# Patient Record
Sex: Female | Born: 1960 | Race: White | Hispanic: No | Smoking: Never smoker
Health system: Southern US, Community
[De-identification: ages and names within clinical notes are randomized; demographics above are authoritative.]

## PROBLEM LIST (undated history)

## (undated) DIAGNOSIS — B0089 Other herpesviral infection: Secondary | ICD-10-CM

## (undated) DIAGNOSIS — I8290 Acute embolism and thrombosis of unspecified vein: Secondary | ICD-10-CM

## (undated) DIAGNOSIS — F32A Depression, unspecified: Secondary | ICD-10-CM

## (undated) DIAGNOSIS — G43909 Migraine, unspecified, not intractable, without status migrainosus: Secondary | ICD-10-CM

## (undated) DIAGNOSIS — I10 Essential (primary) hypertension: Secondary | ICD-10-CM

## (undated) DIAGNOSIS — R7303 Prediabetes: Secondary | ICD-10-CM

## (undated) DIAGNOSIS — G4730 Sleep apnea, unspecified: Secondary | ICD-10-CM

## (undated) DIAGNOSIS — E669 Obesity, unspecified: Secondary | ICD-10-CM

## (undated) DIAGNOSIS — F329 Major depressive disorder, single episode, unspecified: Secondary | ICD-10-CM

## (undated) HISTORY — PX: CHOLECYSTECTOMY: SHX55

## (undated) HISTORY — PX: ABDOMINAL HYSTERECTOMY: SHX81

## (undated) HISTORY — PX: OOPHORECTOMY: SHX86

## (undated) HISTORY — PX: TUBAL LIGATION: SHX77

## (undated) HISTORY — PX: OTHER SURGICAL HISTORY: SHX169

---

## 2006-10-31 ENCOUNTER — Ambulatory Visit: Payer: Self-pay | Admitting: Unknown Physician Specialty

## 2007-11-03 ENCOUNTER — Ambulatory Visit: Payer: Self-pay | Admitting: Unknown Physician Specialty

## 2007-11-25 ENCOUNTER — Ambulatory Visit: Payer: Self-pay | Admitting: Unknown Physician Specialty

## 2008-05-10 ENCOUNTER — Ambulatory Visit: Payer: Self-pay | Admitting: Surgery

## 2008-11-25 ENCOUNTER — Ambulatory Visit: Payer: Self-pay | Admitting: Surgery

## 2009-10-11 ENCOUNTER — Emergency Department: Payer: Self-pay | Admitting: Emergency Medicine

## 2009-10-18 ENCOUNTER — Ambulatory Visit: Payer: Self-pay | Admitting: Surgery

## 2010-01-24 ENCOUNTER — Ambulatory Visit: Payer: Self-pay | Admitting: Surgery

## 2010-02-06 ENCOUNTER — Ambulatory Visit: Payer: Self-pay | Admitting: Surgery

## 2011-02-09 ENCOUNTER — Ambulatory Visit: Payer: Self-pay

## 2011-08-03 ENCOUNTER — Ambulatory Visit: Payer: Self-pay | Admitting: Physician Assistant

## 2012-03-06 ENCOUNTER — Ambulatory Visit: Payer: Self-pay

## 2013-03-10 ENCOUNTER — Ambulatory Visit: Payer: Self-pay

## 2013-04-01 ENCOUNTER — Ambulatory Visit: Payer: Self-pay

## 2014-08-04 ENCOUNTER — Ambulatory Visit: Payer: Self-pay

## 2014-08-13 ENCOUNTER — Ambulatory Visit: Payer: Self-pay

## 2015-01-19 ENCOUNTER — Other Ambulatory Visit: Payer: Self-pay | Admitting: Obstetrics and Gynecology

## 2015-01-19 DIAGNOSIS — N6320 Unspecified lump in the left breast, unspecified quadrant: Secondary | ICD-10-CM

## 2015-01-26 ENCOUNTER — Other Ambulatory Visit
Admission: RE | Admit: 2015-01-26 | Discharge: 2015-01-26 | Disposition: A | Discharge status: Home / Self Care | Payer: Managed Care, Other (non HMO) | Source: Other Acute Inpatient Hospital | Attending: Internal Medicine | Admitting: Internal Medicine

## 2015-01-26 DIAGNOSIS — R0602 Shortness of breath: Secondary | ICD-10-CM | POA: Insufficient documentation

## 2015-01-26 DIAGNOSIS — R079 Chest pain, unspecified: Secondary | ICD-10-CM | POA: Diagnosis present

## 2015-01-26 LAB — FIBRIN DERIVATIVES D-DIMER (ARMC ONLY): Fibrin derivatives D-dimer (ARMC): 235.6 (ref 0–499)

## 2015-01-26 LAB — TROPONIN I: Troponin I: <0.03 ng/mL (ref <0.031)

## 2015-02-15 ENCOUNTER — Ambulatory Visit
Admission: RE | Admit: 2015-02-15 | Discharge: 2015-02-15 | Disposition: A | Discharge status: Home / Self Care | Payer: Managed Care, Other (non HMO) | Source: Ambulatory Visit | Attending: Obstetrics and Gynecology | Admitting: Obstetrics and Gynecology

## 2015-02-15 ENCOUNTER — Other Ambulatory Visit: Payer: Self-pay | Admitting: Obstetrics and Gynecology

## 2015-02-15 DIAGNOSIS — N63 Unspecified lump in unspecified breast: Secondary | ICD-10-CM

## 2015-02-15 DIAGNOSIS — N6320 Unspecified lump in the left breast, unspecified quadrant: Secondary | ICD-10-CM

## 2015-02-16 ENCOUNTER — Other Ambulatory Visit: Payer: Self-pay | Admitting: Obstetrics and Gynecology

## 2015-02-16 DIAGNOSIS — N63 Unspecified lump in unspecified breast: Secondary | ICD-10-CM

## 2015-08-16 ENCOUNTER — Other Ambulatory Visit: Payer: Managed Care, Other (non HMO)

## 2015-08-16 ENCOUNTER — Ambulatory Visit: Payer: Managed Care, Other (non HMO) | Attending: Obstetrics and Gynecology

## 2015-10-13 ENCOUNTER — Encounter: Payer: Self-pay | Admitting: *Deleted

## 2015-10-14 ENCOUNTER — Encounter
Admission: RE | Disposition: A | Discharge status: Home / Self Care | Payer: Self-pay | Source: Ambulatory Visit | Attending: Unknown Physician Specialty

## 2015-10-14 ENCOUNTER — Ambulatory Visit
Admission: RE | Admit: 2015-10-14 | Discharge: 2015-10-14 | Disposition: A | Discharge status: Home / Self Care | Payer: Managed Care, Other (non HMO) | Source: Ambulatory Visit | Attending: Unknown Physician Specialty | Admitting: Unknown Physician Specialty

## 2015-10-14 ENCOUNTER — Ambulatory Visit: Payer: Managed Care, Other (non HMO) | Admitting: Anesthesiology

## 2015-10-14 ENCOUNTER — Encounter: Payer: Self-pay | Admitting: Anesthesiology

## 2015-10-14 DIAGNOSIS — Z79899 Other long term (current) drug therapy: Secondary | ICD-10-CM | POA: Insufficient documentation

## 2015-10-14 DIAGNOSIS — Z1211 Encounter for screening for malignant neoplasm of colon: Secondary | ICD-10-CM | POA: Diagnosis not present

## 2015-10-14 DIAGNOSIS — F329 Major depressive disorder, single episode, unspecified: Secondary | ICD-10-CM | POA: Diagnosis not present

## 2015-10-14 DIAGNOSIS — E119 Type 2 diabetes mellitus without complications: Secondary | ICD-10-CM | POA: Insufficient documentation

## 2015-10-14 DIAGNOSIS — Z7982 Long term (current) use of aspirin: Secondary | ICD-10-CM | POA: Diagnosis not present

## 2015-10-14 DIAGNOSIS — I10 Essential (primary) hypertension: Secondary | ICD-10-CM | POA: Diagnosis not present

## 2015-10-14 DIAGNOSIS — Z6837 Body mass index (BMI) 37.0-37.9, adult: Secondary | ICD-10-CM | POA: Insufficient documentation

## 2015-10-14 DIAGNOSIS — K635 Polyp of colon: Secondary | ICD-10-CM | POA: Insufficient documentation

## 2015-10-14 DIAGNOSIS — E669 Obesity, unspecified: Secondary | ICD-10-CM | POA: Diagnosis not present

## 2015-10-14 DIAGNOSIS — Z79890 Hormone replacement therapy (postmenopausal): Secondary | ICD-10-CM | POA: Diagnosis not present

## 2015-10-14 DIAGNOSIS — K640 First degree hemorrhoids: Secondary | ICD-10-CM | POA: Insufficient documentation

## 2015-10-14 DIAGNOSIS — D123 Benign neoplasm of transverse colon: Secondary | ICD-10-CM | POA: Diagnosis not present

## 2015-10-14 HISTORY — DX: Obesity, unspecified: E66.9

## 2015-10-14 HISTORY — DX: Major depressive disorder, single episode, unspecified: F32.9

## 2015-10-14 HISTORY — DX: Other herpesviral infection: B00.89

## 2015-10-14 HISTORY — DX: Migraine, unspecified, not intractable, without status migrainosus: G43.909

## 2015-10-14 HISTORY — DX: Depression, unspecified: F32.A

## 2015-10-14 HISTORY — PX: COLONOSCOPY WITH PROPOFOL: SHX5780

## 2015-10-14 HISTORY — DX: Essential (primary) hypertension: I10

## 2015-10-14 LAB — POCT PREGNANCY, URINE: PREG TEST UR: NEGATIVE

## 2015-10-14 LAB — GLUCOSE, CAPILLARY: Glucose-Capillary: 88 mg/dL (ref 65–99)

## 2015-10-14 SURGERY — COLONOSCOPY WITH PROPOFOL
Anesthesia: General

## 2015-10-14 MED ORDER — MIDAZOLAM HCL 2 MG/2ML IJ SOLN
INTRAMUSCULAR | Status: DC | PRN
Start: 2015-10-14 — End: 2015-10-14
  Administered 2015-10-14: 1 mg via INTRAVENOUS

## 2015-10-14 MED ORDER — FENTANYL CITRATE (PF) 100 MCG/2ML IJ SOLN
INTRAMUSCULAR | Status: DC | PRN
  Administered 2015-10-14: 50 ug via INTRAVENOUS

## 2015-10-14 MED ORDER — PHENYLEPHRINE HCL 10 MG/ML IJ SOLN
INTRAMUSCULAR | Status: DC | PRN
  Administered 2015-10-14 (×2): 25 ug via INTRAVENOUS

## 2015-10-14 MED ORDER — SODIUM CHLORIDE 0.9 % IV SOLN
INTRAVENOUS | Status: DC
  Administered 2015-10-14: 1000 mL via INTRAVENOUS

## 2015-10-14 MED ORDER — LIDOCAINE HCL (PF) 1 % IJ SOLN
INTRAMUSCULAR | Status: AC
  Filled 2015-10-14: qty 2

## 2015-10-14 MED ORDER — PROPOFOL 500 MG/50ML IV EMUL
INTRAVENOUS | Status: DC | PRN
  Administered 2015-10-14: 120 ug/kg/min via INTRAVENOUS

## 2015-10-14 NOTE — H&P (Signed)
Primary Care Physician:  Adin Hector, MD Primary Gastroenterologist:  Dr. Vira Agar  Pre-Procedure History & Physical: HPI:  Christine Carroll is a 55 y.o. female is here for an colonoscopy.   Past Medical History  Diagnosis Date  . Depression   . Diabetes mellitus without complication ([place])   . Herpetic whitlow   . Hypertension   . Migraines   . Obesity     Past Surgical History  Procedure Laterality Date  . Cesarean    . Tubal ligation    . Cholecystectomy      Prior to Admission medications   Medication Sig Start Date End Date Taking? Authorizing Provider  aspirin EC 81 MG tablet Take 81 mg by mouth daily.   Yes Historical Provider, MD  Black Cohosh 20 MG TABS Take 20 mg by mouth.   Yes Historical Provider, MD  citalopram (CELEXA) 40 MG tablet Take 40 mg by mouth daily.   Yes Historical Provider, MD  Cranberry 400 MG CAPS Take 400 capsules by mouth.   Yes Historical Provider, MD  ergocalciferol (VITAMIN D2) 50000 units capsule Take 50,000 Units by mouth once a week.   Yes Historical Provider, MD  estradiol (CLIMARA - DOSED IN MG/24 HR) 0.0375 mg/24hr patch Place 0.0375 mg onto the skin once a week.   Yes Historical Provider, MD  loratadine (CLARITIN) 10 MG tablet Take 10 mg by mouth daily.   Yes Historical Provider, MD  LORazepam (ATIVAN) 0.5 MG tablet Take 0.5 mg by mouth every 8 (eight) hours as needed for anxiety.   Yes Historical Provider, MD  medroxyPROGESTERone (PROVERA) 10 MG tablet Take 10 mg by mouth daily.   Yes Historical Provider, MD  nystatin [facility]) powder Apply topically 3 (three) times daily.   Yes Historical Provider, MD  vitamin B-12 (CYANOCOBALAMIN) 1000 MCG tablet Take 1,000 mcg by mouth daily.   Yes Historical Provider, MD  calcium carbonate (TUMS - DOSED IN MG ELEMENTAL CALCIUM) 500 MG chewable tablet Chew 1 tablet by mouth daily. Reported on 10/14/2015    Historical Provider, MD    Allergies as of 10/05/2015  . (Not on File)    Family History   Problem Relation Age of Onset  . Breast cancer Maternal Aunt     Social History   Social History  . Marital Status: Single    Spouse Name: N/A  . Number of Children: N/A  . Years of Education: N/A   Occupational History  . Not on file.   Social History Main Topics  . Smoking status: Never Smoker   . Smokeless tobacco: Never Used  . Alcohol Use: No  . Drug Use: No  . Sexual Activity: Not on file   Other Topics Concern  . Not on file   Social History Narrative    Review of Systems: See HPI, otherwise negative ROS  Physical Exam: BP 140/77 mmHg  Pulse 83  Temp(Src) 99.2 F (37.3 C) (Tympanic)  Resp 16  Ht 5\' 4"  (1.626 m)  Wt 97.977 kg (216 lb)  BMI 37.06 kg/m2  SpO2 96%  LMP  General:   Alert,  pleasant and cooperative in NAD Head:  Normocephalic and atraumatic. Neck:  Supple; no masses or thyromegaly. Lungs:  Clear throughout to auscultation.    Heart:  Regular rate and rhythm. Abdomen:  Soft, nontender and nondistended. Normal bowel sounds, without guarding, and without rebound.   Neurologic:  Alert and  oriented x4;  grossly normal neurologically.  Impression/Plan: Christine Rise  Carroll is here for an colonoscopy to be performed for screening  Risks, benefits, limitations, and alternatives regarding  colonoscopy have been reviewed with the patient.  Questions have been answered.  All parties agreeable.   Gaylyn Cheers, MD  10/14/2015, 11:05 AM

## 2015-10-14 NOTE — Anesthesia Procedure Notes (Signed)
Performed by: COOK-MARTIN, Jeris Roser Pre-anesthesia Checklist: Patient identified, Emergency Drugs available, Suction available, Patient being monitored and Timeout performed Patient Re-evaluated:Patient Re-evaluated prior to inductionOxygen Delivery Method: Nasal cannula Preoxygenation: Pre-oxygenation with 100% oxygen Intubation Type: IV induction Placement Confirmation: positive ETCO2 and CO2 detector       

## 2015-10-14 NOTE — Anesthesia Preprocedure Evaluation (Signed)
Anesthesia Evaluation  Patient identified by MRN, date of birth, ID band Patient awake    Reviewed: Allergy & Precautions, H&P , NPO status , Patient's Chart, lab work & pertinent test results, reviewed documented beta blocker date and time   History of Anesthesia Complications Negative for: history of anesthetic complications  Airway Mallampati: III  TM Distance: >3 FB Neck ROM: full    Dental no notable dental hx. (+) Caps, Missing   Pulmonary neg pulmonary ROS,    Pulmonary exam normal breath sounds clear to auscultation       Cardiovascular Exercise Tolerance: Good hypertension, On Medications (-) angina(-) CAD, (-) Past MI, (-) Cardiac Stents and (-) CABG Normal cardiovascular exam(-) dysrhythmias (-) Valvular Problems/Murmurs Rhythm:regular Rate:Normal     Neuro/Psych negative neurological ROS  negative psych ROS   GI/Hepatic negative GI ROS, Neg liver ROS,   Endo/Other  diabetes (borderline)  Renal/GU negative Renal ROS  negative genitourinary   Musculoskeletal   Abdominal   Peds  Hematology negative hematology ROS (+)   Anesthesia Other Findings Past Medical History:   Depression                                                   Diabetes mellitus without complication (HCC)                 Herpetic whitlow                                             Hypertension                                                 Migraines                                                    Obesity                                                      Reproductive/Obstetrics negative OB ROS                             Anesthesia Physical Anesthesia Plan  ASA: II  Anesthesia Plan: General   Post-op Pain Management:    Induction:   Airway Management Planned:   Additional Equipment:   Intra-op Plan:   Post-operative Plan:   Informed Consent: I have reviewed the patients History and  Physical, chart, labs and discussed the procedure including the risks, benefits and alternatives for the proposed anesthesia with the patient or authorized representative who has indicated his/her understanding and acceptance.   Dental Advisory Given  Plan Discussed with: Anesthesiologist, CRNA and Surgeon  Anesthesia Plan Comments:         Anesthesia Quick Evaluation

## 2015-10-14 NOTE — Anesthesia Postprocedure Evaluation (Signed)
Anesthesia Post Note  Patient: Christine Carroll  Procedure(s) Performed: Procedure(s) (LRB): COLONOSCOPY WITH PROPOFOL (N/A)  Patient location during evaluation: Endoscopy Anesthesia Type: General Level of consciousness: awake and alert Pain management: pain level controlled Vital Signs Assessment: post-procedure vital signs reviewed and stable Respiratory status: spontaneous breathing, nonlabored ventilation, respiratory function stable and patient connected to nasal cannula oxygen Cardiovascular status: blood pressure returned to baseline and stable Postop Assessment: no signs of nausea or vomiting Anesthetic complications: no    Last Vitals:  Filed Vitals:   10/14/15 1210 10/14/15 1220  BP: 121/65 119/76  Pulse: 67 69  Temp:    Resp: 15 16    Last Pain: There were no vitals filed for this visit.               Martha Clan

## 2015-10-14 NOTE — Transfer of Care (Signed)
Immediate Anesthesia Transfer of Care Note  Patient: Christine Carroll  Procedure(s) Performed: Procedure(s): COLONOSCOPY WITH PROPOFOL (N/A)  Patient Location: PACU  Anesthesia Type:General  Level of Consciousness: awake and sedated  Airway & Oxygen Therapy: Patient Spontanous Breathing and Patient connected to face mask oxygen  Post-op Assessment: Report given to RN and Post -op Vital signs reviewed and stable  Post vital signs: Reviewed and stable  Last Vitals:  Filed Vitals:   10/14/15 1007  BP: 140/77  Pulse: 83  Temp: 37.3 C  Resp: 16    Last Pain: There were no vitals filed for this visit.       Complications: No apparent anesthesia complications

## 2015-10-14 NOTE — Op Note (Signed)
[facility] Gastroenterology Patient Name: Christine Carroll Procedure Date: 10/14/2015 11:00 AM MRN: [id number] Account #: [id number] Date of Birth: 07/18/1960 Admit Type: Outpatient Age: 54 Room: [facility] ENDO ROOM 1 Gender: Female Note Status: Finalized Procedure:            Colonoscopy Indications:          Screening for colorectal malignant neoplasm Providers:            Manya Silvas, MD Referring MD:         Ramonita Lab, MD (Referring MD) Medicines:            Propofol per Anesthesia Complications:        No immediate complications. Procedure:            Pre-Anesthesia Assessment:                       - After reviewing the risks and benefits, the patient                        was deemed in satisfactory condition to undergo the                        procedure.                       After obtaining informed consent, the colonoscope was                        passed under direct vision. Throughout the procedure,                        the patient's blood pressure, pulse, and oxygen                        saturations were monitored continuously. The                        Colonoscope was introduced through the anus and                        advanced to the the cecum, identified by appendiceal                        orifice and ileocecal valve. The colonoscopy was                        performed without difficulty. The patient tolerated the                        procedure well. The quality of the bowel preparation                        was excellent. Findings:      A diminutive polyp was found in the transverse colon. The polyp was       sessile. The polyp was removed with a cold biopsy forceps. Resection and       retrieval were complete.      A diminutive polyp was found in the transverse colon. The polyp was       sessile. The polyp was removed with a cold biopsy forceps. Resection and  retrieval were complete.      A diminutive polyp was found in  the transverse colon. The polyp was       sessile. The polyp was removed with a hot snare. Resection and retrieval       were complete.      Internal hemorrhoids were found during endoscopy. The hemorrhoids were       small and Grade I (internal hemorrhoids that do not prolapse).      The exam was otherwise without abnormality. Impression:           - One diminutive polyp in the transverse colon, removed                        with a cold biopsy forceps. Resected and retrieved.                       - One diminutive polyp in the transverse colon, removed                        with a cold biopsy forceps. Resected and retrieved.                       - One diminutive polyp in the transverse colon, removed                        with a hot snare. Resected and retrieved.                       - Internal hemorrhoids.                       - The examination was otherwise normal. Recommendation:       - Await pathology results. Manya Silvas, MD 10/14/2015 11:45:10 AM This report has been signed electronically. Number of Addenda: 0 Note Initiated On: 10/14/2015 11:00 AM Scope Withdrawal Time: 0 hours 22 minutes 15 seconds  Total Procedure Duration: 0 hours 30 minutes 0 seconds       [facility]

## 2015-10-17 LAB — SURGICAL PATHOLOGY

## 2015-10-18 ENCOUNTER — Encounter: Payer: Self-pay | Admitting: Unknown Physician Specialty

## 2016-01-25 ENCOUNTER — Other Ambulatory Visit: Payer: Self-pay | Admitting: Obstetrics and Gynecology

## 2016-01-25 DIAGNOSIS — N6320 Unspecified lump in the left breast, unspecified quadrant: Secondary | ICD-10-CM

## 2016-01-25 DIAGNOSIS — N63 Unspecified lump in unspecified breast: Secondary | ICD-10-CM

## 2016-02-07 IMAGING — US US BREAST LTD UNI LEFT INC AXILLA
1 series · 9 of 9 positions shown · non-contrast
Comparison: 08/13/2014 and prior mammograms dating back to
07/14/2002

CLINICAL DATA: 54-year-old female -followup left breast masses.

EXAM:
DIGITAL DIAGNOSTIC LEFT MAMMOGRAM WITH 3D TOMOSYNTHESIS WITH CAD
ULTRASOUND LEFT BREAST

[Series 1: us breast ltd uni left inc axilla · 0.06mm/px · 9 of 9 slices shown]
[im 1/9]
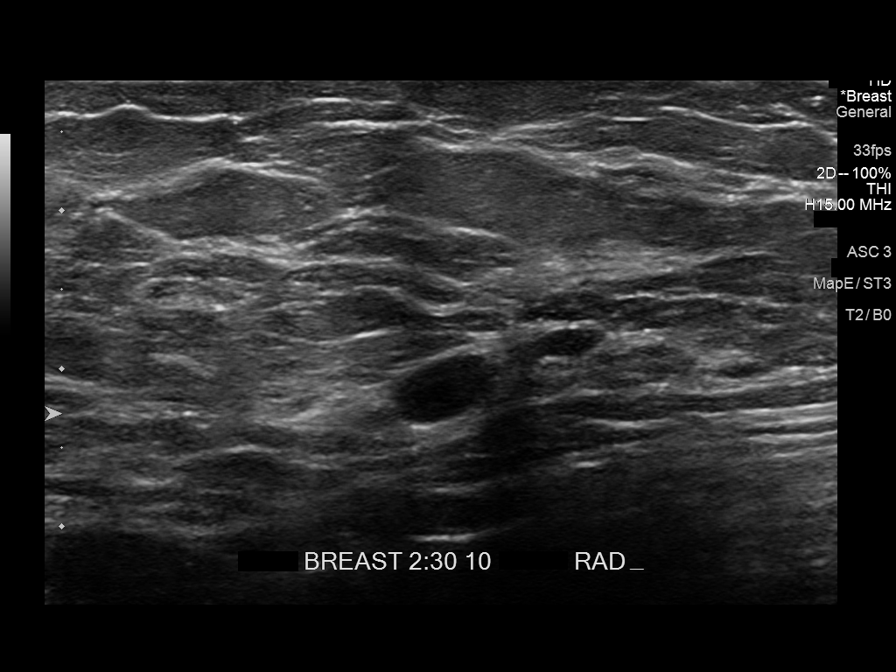
[im 2/9]
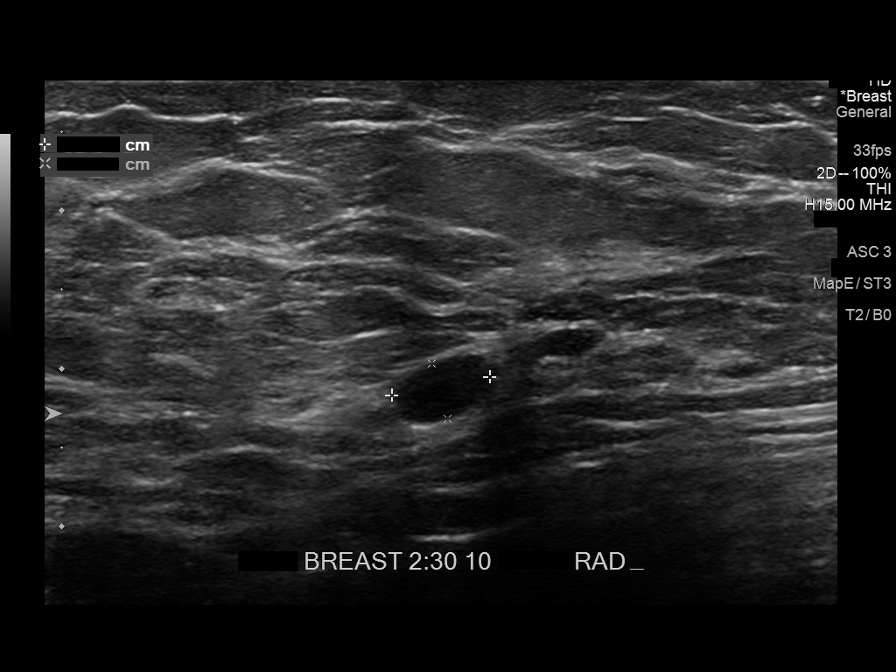
[im 3/9]
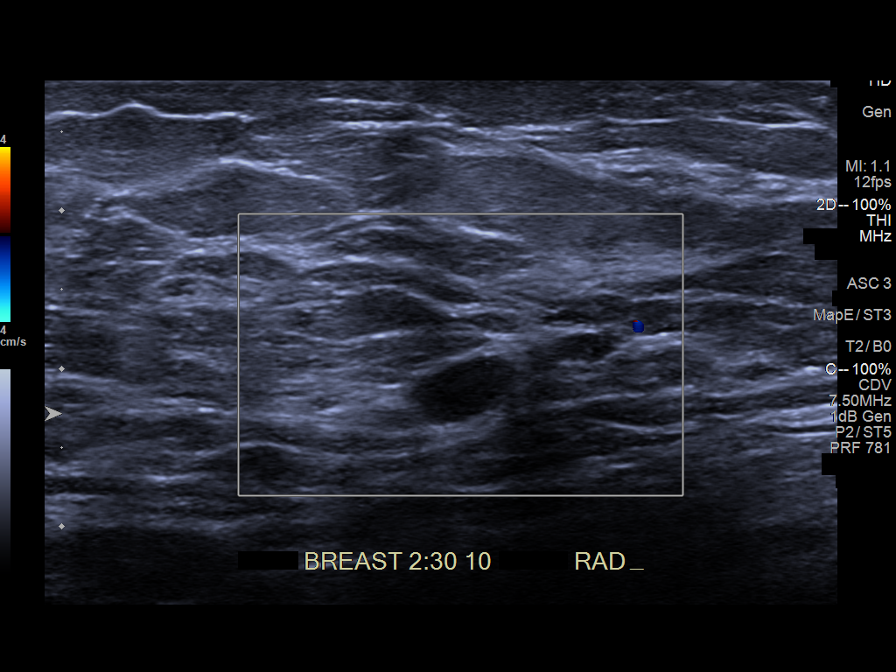
[im 4/9]
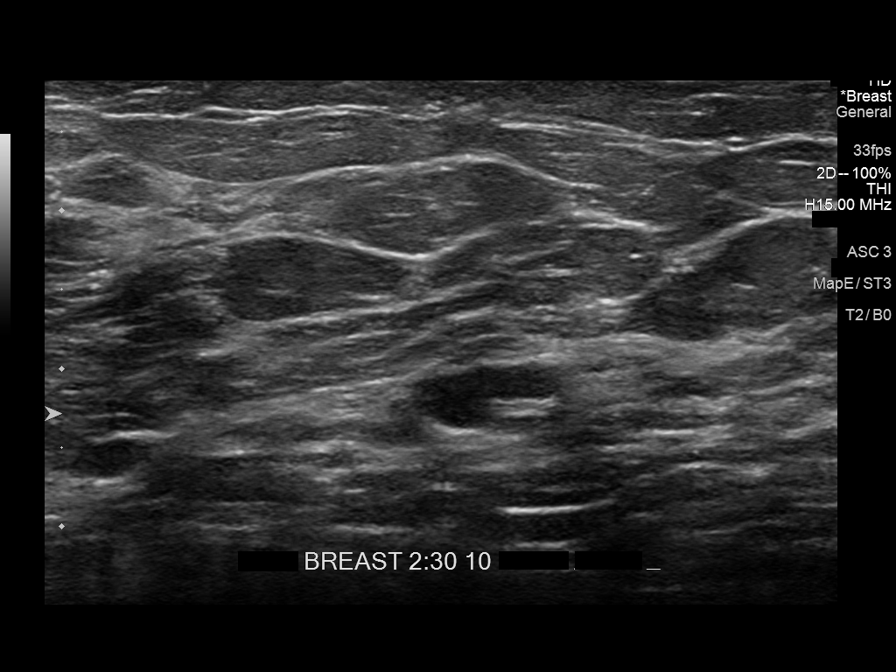
[im 5/9]
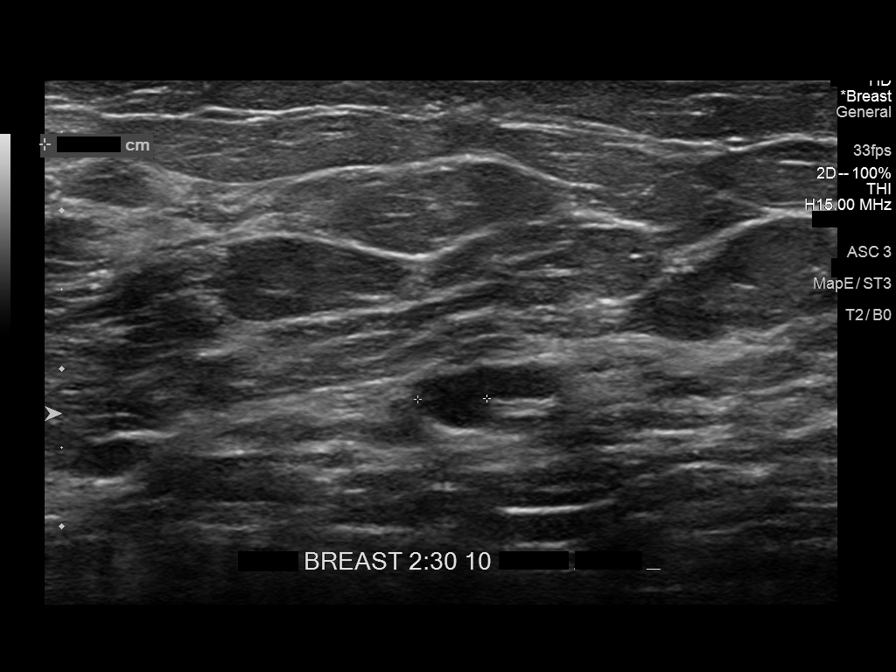
[im 6/9]
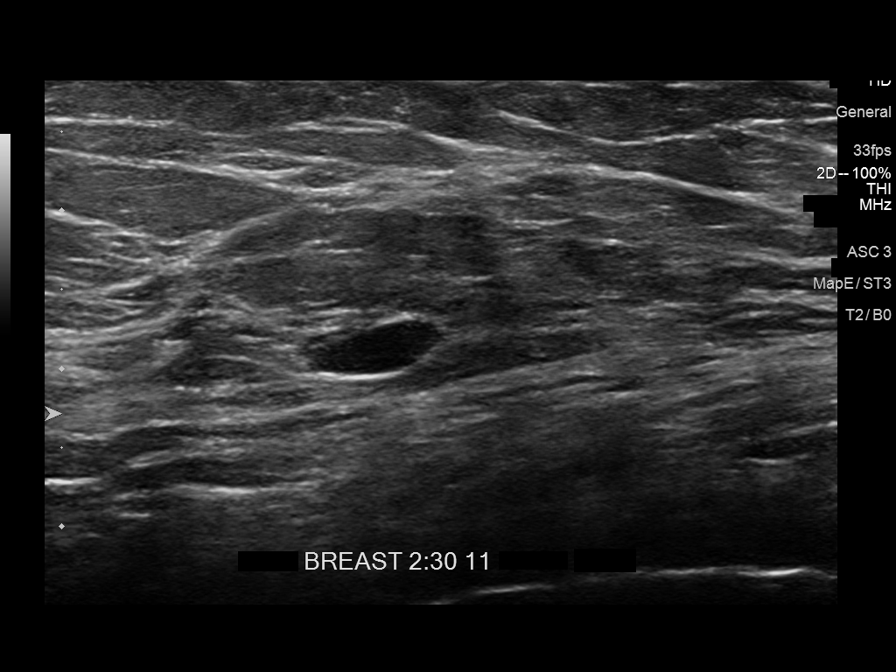
[im 7/9]
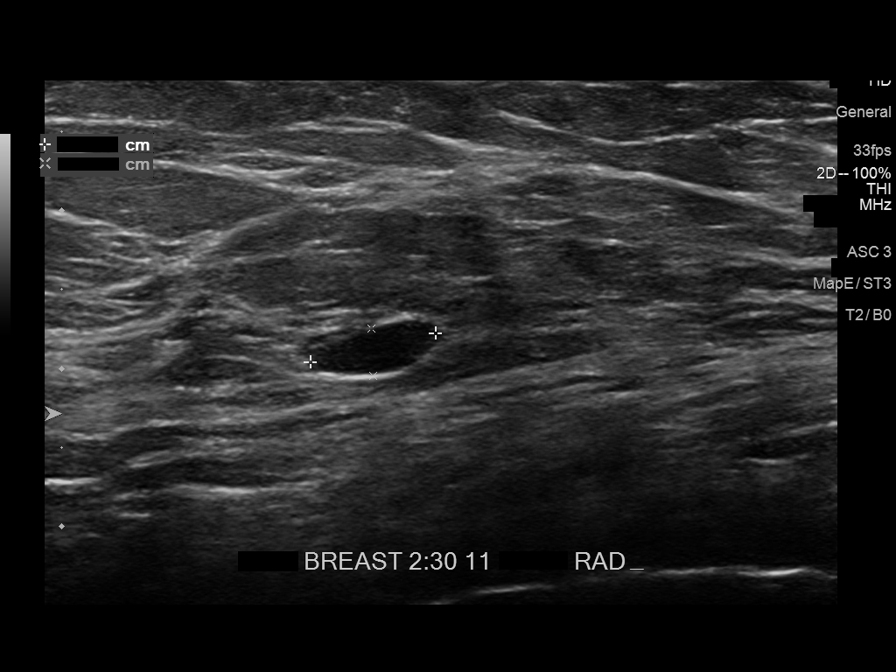
[im 8/9]
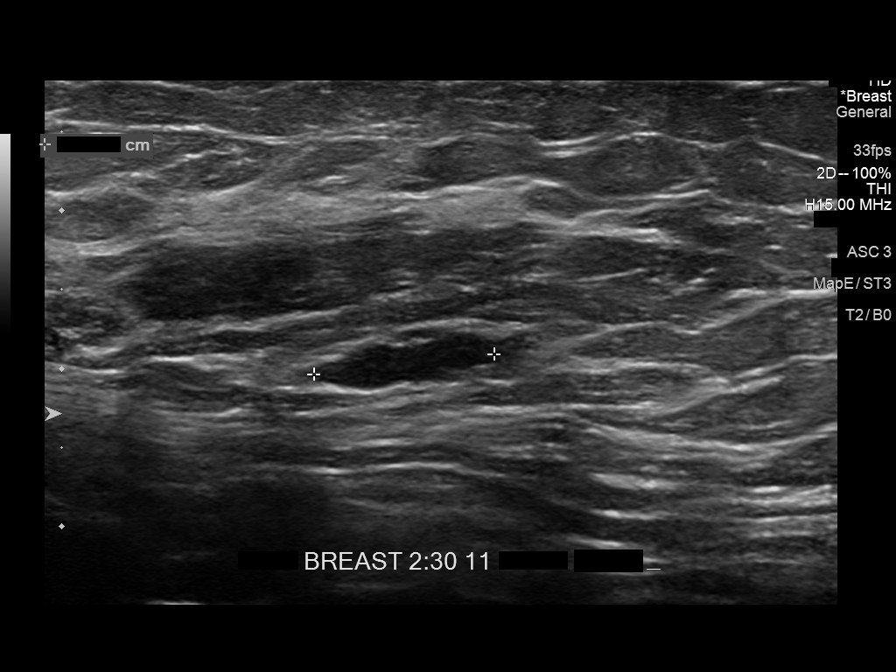
[im 9/9]
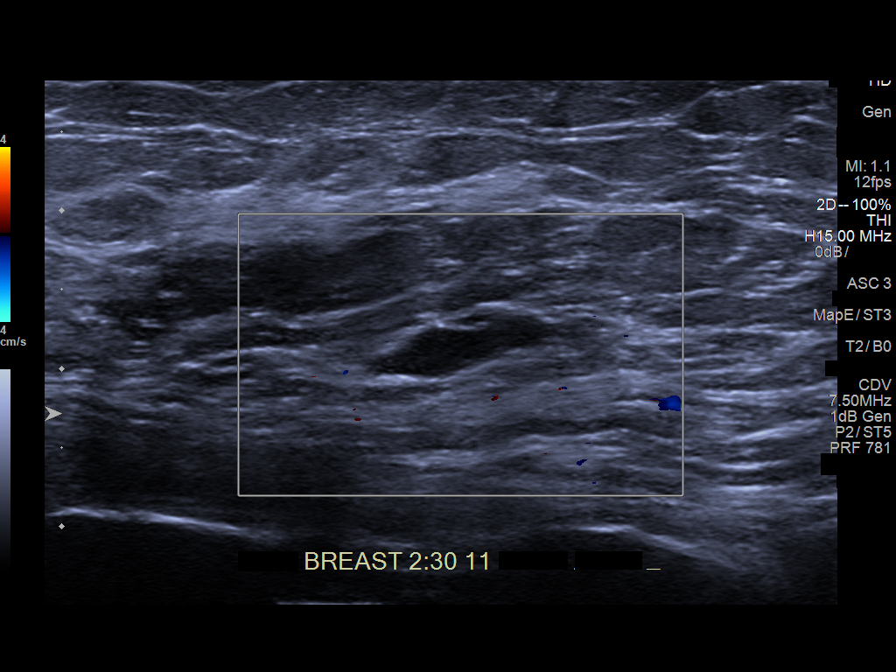

[9 of 9 positions shown; findings below may reference images not displayed]

ACR Breast Density Category b: There are scattered areas of
fibroglandular density.
FINDINGS: Routine 2D and 3D views the left breast demonstrate a stable
circumscribed oval mass within the posterior outer left breast.

No new mass, distortion or worrisome calcifications are identified
within the left breast.

Mammographic images were processed with CAD.

On physical exam, no palpable abnormalities identified within the
left breast.

Targeted ultrasound is performed, showing 2 very hypoechoic
circumscribed oval parallel masses within the left breast, the first
measuring 0.8 x 0.3 x 1.2 cm at the [DATE] position 11 cm from the
nipple and the second measuring 0.6 x 0.4 x 0.4 cm at the [DATE]
position 10 cm from the nipple. These likely represent benign cysts
but followup in 6 months is recommended to ensure 1 year stability.
IMPRESSION: Two very hypoechoic masses within the upper-outer left breast,
almost certainly representing benign cysts but followup in 6 months
is recommended to ensure 1 year stability.

RECOMMENDATION:
Bilateral diagnostic mammograms and left breast ultrasound in 6
months to resume annual mammogram schedule and to reassess likely
benign left breast finding.

I have discussed the findings and recommendations with the patient.
Results were also provided in writing at the conclusion of the
visit. If applicable, a reminder letter will be sent to the patient
regarding the next appointment.

BI-RADS CATEGORY  3: Probably benign.

## 2016-02-16 ENCOUNTER — Other Ambulatory Visit: Payer: Managed Care, Other (non HMO)

## 2016-02-16 ENCOUNTER — Ambulatory Visit: Payer: Managed Care, Other (non HMO) | Attending: Obstetrics and Gynecology

## 2016-03-20 ENCOUNTER — Ambulatory Visit
Admission: RE | Admit: 2016-03-20 | Discharge: 2016-03-20 | Disposition: A | Discharge status: Home / Self Care | Payer: Managed Care, Other (non HMO) | Source: Ambulatory Visit | Attending: Obstetrics and Gynecology | Admitting: Obstetrics and Gynecology

## 2016-03-20 DIAGNOSIS — N63 Unspecified lump in unspecified breast: Secondary | ICD-10-CM

## 2016-03-20 DIAGNOSIS — N6320 Unspecified lump in the left breast, unspecified quadrant: Secondary | ICD-10-CM

## 2016-04-03 ENCOUNTER — Emergency Department: Payer: Managed Care, Other (non HMO)

## 2016-04-03 ENCOUNTER — Observation Stay
Admission: EM | Admit: 2016-04-03 | Discharge: 2016-04-05 | Disposition: A | Discharge status: Home / Self Care | Payer: Managed Care, Other (non HMO) | Attending: Obstetrics and Gynecology | Admitting: Obstetrics and Gynecology

## 2016-04-03 DIAGNOSIS — D250 Submucous leiomyoma of uterus: Secondary | ICD-10-CM | POA: Diagnosis not present

## 2016-04-03 DIAGNOSIS — N950 Postmenopausal bleeding: Principal | ICD-10-CM | POA: Insufficient documentation

## 2016-04-03 DIAGNOSIS — E119 Type 2 diabetes mellitus without complications: Secondary | ICD-10-CM | POA: Insufficient documentation

## 2016-04-03 DIAGNOSIS — N939 Abnormal uterine and vaginal bleeding, unspecified: Secondary | ICD-10-CM | POA: Diagnosis present

## 2016-04-03 DIAGNOSIS — I10 Essential (primary) hypertension: Secondary | ICD-10-CM | POA: Insufficient documentation

## 2016-04-03 DIAGNOSIS — F329 Major depressive disorder, single episode, unspecified: Secondary | ICD-10-CM | POA: Diagnosis not present

## 2016-04-03 DIAGNOSIS — N9489 Other specified conditions associated with female genital organs and menstrual cycle: Secondary | ICD-10-CM | POA: Insufficient documentation

## 2016-04-03 DIAGNOSIS — N838 Other noninflammatory disorders of ovary, fallopian tube and broad ligament: Secondary | ICD-10-CM | POA: Insufficient documentation

## 2016-04-03 DIAGNOSIS — Z6838 Body mass index (BMI) 38.0-38.9, adult: Secondary | ICD-10-CM | POA: Insufficient documentation

## 2016-04-03 DIAGNOSIS — E669 Obesity, unspecified: Secondary | ICD-10-CM | POA: Diagnosis not present

## 2016-04-03 DIAGNOSIS — N840 Polyp of corpus uteri: Secondary | ICD-10-CM | POA: Insufficient documentation

## 2016-04-03 DIAGNOSIS — Z7982 Long term (current) use of aspirin: Secondary | ICD-10-CM | POA: Diagnosis not present

## 2016-04-03 DIAGNOSIS — Z885 Allergy status to narcotic agent status: Secondary | ICD-10-CM | POA: Insufficient documentation

## 2016-04-03 DIAGNOSIS — N800 Endometriosis of uterus: Secondary | ICD-10-CM | POA: Diagnosis not present

## 2016-04-03 DIAGNOSIS — N83202 Unspecified ovarian cyst, left side: Secondary | ICD-10-CM | POA: Diagnosis not present

## 2016-04-03 LAB — CBC WITH DIFFERENTIAL/PLATELET
BASOS ABS: 0.1 10*3/uL (ref 0–0.1)
BASOS PCT: 1 %
EOS PCT: 3 %
Eosinophils Absolute: 0.3 10*3/uL (ref 0–0.7)
HCT: 40 % (ref 35.0–47.0)
Hemoglobin: 13.8 g/dL (ref 12.0–16.0)
Lymphocytes Relative: 28 %
Lymphs Abs: 2.7 10*3/uL (ref 1.0–3.6)
MCH: 30.4 pg (ref 26.0–34.0)
MCHC: 34.6 g/dL (ref 32.0–36.0)
MCV: 88 fL (ref 80.0–100.0)
MONO ABS: 0.7 10*3/uL (ref 0.2–0.9)
Monocytes Relative: 7 %
Neutro Abs: 5.7 10*3/uL (ref 1.4–6.5)
Neutrophils Relative %: 61 %
PLATELETS: 352 10*3/uL (ref 150–440)
RBC: 4.55 MIL/uL (ref 3.80–5.20)
RDW: 13.2 % (ref 11.5–14.5)
WBC: 9.5 10*3/uL (ref 3.6–11.0)

## 2016-04-03 LAB — COMPREHENSIVE METABOLIC PANEL
ALT: 16 U/L (ref 14–54)
ANION GAP: 8 (ref 5–15)
AST: 20 U/L (ref 15–41)
Albumin: 4.1 g/dL (ref 3.5–5.0)
Alkaline Phosphatase: 69 U/L (ref 38–126)
BUN: 13 mg/dL (ref 6–20)
CHLORIDE: 107 mmol/L (ref 101–111)
CO2: 25 mmol/L (ref 22–32)
Calcium: 8.7 mg/dL — ABNORMAL LOW (ref 8.9–10.3)
Creatinine, Ser: 0.75 mg/dL (ref 0.44–1.00)
GFR calc Af Amer: >60 mL/min (ref >60)
Glucose, Bld: 100 mg/dL — ABNORMAL HIGH (ref 65–99)
POTASSIUM: 3.9 mmol/L (ref 3.5–5.1)
Sodium: 140 mmol/L (ref 135–145)
Total Bilirubin: 0.2 mg/dL — ABNORMAL LOW (ref 0.3–1.2)
Total Protein: 7 g/dL (ref 6.5–8.1)

## 2016-04-03 LAB — ABO/RH: ABO/RH(D): A POS

## 2016-04-03 LAB — TYPE AND SCREEN
ABO/RH(D): A POS
ANTIBODY SCREEN: NEGATIVE

## 2016-04-03 MED ORDER — IBUPROFEN 600 MG PO TABS
600.0000 mg | ORAL_TABLET | Freq: Four times a day (QID) | ORAL | Status: DC | PRN
  Administered 2016-04-03 (×2): 600 mg via ORAL
  Filled 2016-04-03 (×2): qty 1

## 2016-04-03 MED ORDER — LACTATED RINGERS IV SOLN
INTRAVENOUS | Status: DC
  Administered 2016-04-03: 15:00:00 via INTRAVENOUS

## 2016-04-03 MED ORDER — ONDANSETRON HCL 4 MG/2ML IJ SOLN
4.0000 mg | Freq: Four times a day (QID) | INTRAMUSCULAR | Status: DC | PRN
Start: 2016-04-03 — End: 2016-04-04

## 2016-04-03 MED ORDER — LACTATED RINGERS IV SOLN
INTRAVENOUS | Status: DC
  Administered 2016-04-03 – 2016-04-04 (×3): via INTRAVENOUS

## 2016-04-03 MED ORDER — MAGNESIUM HYDROXIDE 400 MG/5ML PO SUSP: 30.0000 mL | Freq: Every day | ORAL | Status: DC | PRN

## 2016-04-03 MED ORDER — HYDROCHLOROTHIAZIDE 12.5 MG PO CAPS
12.5000 mg | ORAL_CAPSULE | Freq: Every day | ORAL | Status: DC
  Administered 2016-04-03: 12.5 mg via ORAL
  Filled 2016-04-03: qty 1

## 2016-04-03 MED ORDER — DOCUSATE SODIUM 100 MG PO CAPS
100.0000 mg | ORAL_CAPSULE | Freq: Two times a day (BID) | ORAL | Status: DC
  Administered 2016-04-03 (×2): 100 mg via ORAL
  Filled 2016-04-03 (×2): qty 1

## 2016-04-03 MED ORDER — MAGNESIUM CITRATE PO SOLN
1.0000 | Freq: Once | ORAL | Status: DC | PRN
Start: 2016-04-03 — End: 2016-04-04
  Filled 2016-04-03: qty 296

## 2016-04-03 MED ORDER — HYDROMORPHONE HCL 1 MG/ML IJ SOLN
0.2000 mg | INTRAMUSCULAR | Status: DC | PRN
Start: 2016-04-03 — End: 2016-04-04
  Administered 2016-04-03: 0.6 mg via INTRAVENOUS
  Filled 2016-04-03: qty 1

## 2016-04-03 MED ORDER — ONDANSETRON HCL 4 MG/2ML IJ SOLN
INTRAMUSCULAR | Status: AC
  Administered 2016-04-03: 4 mg via INTRAVENOUS
  Filled 2016-04-03: qty 2

## 2016-04-03 MED ORDER — OXYCODONE-ACETAMINOPHEN 5-325 MG PO TABS: 1.0000 | ORAL_TABLET | ORAL | Status: DC | PRN

## 2016-04-03 MED ORDER — ZOLPIDEM TARTRATE 5 MG PO TABS
5.0000 mg | ORAL_TABLET | Freq: Every evening | ORAL | Status: DC | PRN
  Administered 2016-04-03: 5 mg via ORAL
  Filled 2016-04-03: qty 1

## 2016-04-03 MED ORDER — ALUM & MAG HYDROXIDE-SIMETH 200-200-20 MG/5ML PO SUSP: 30.0000 mL | ORAL | Status: DC | PRN

## 2016-04-03 MED ORDER — ONDANSETRON HCL 4 MG PO TABS: 4.0000 mg | ORAL_TABLET | Freq: Four times a day (QID) | ORAL | Status: DC | PRN

## 2016-04-03 MED ORDER — ONDANSETRON HCL 4 MG/2ML IJ SOLN
4.0000 mg | Freq: Once | INTRAMUSCULAR | Status: AC
  Administered 2016-04-03: 4 mg via INTRAVENOUS

## 2016-04-03 MED ORDER — BISACODYL 5 MG PO TBEC: 5.0000 mg | DELAYED_RELEASE_TABLET | Freq: Every day | ORAL | Status: DC | PRN

## 2016-04-03 MED ORDER — MORPHINE SULFATE (PF) 2 MG/ML IV SOLN
INTRAVENOUS | Status: AC
  Administered 2016-04-03: 4 mg via INTRAVENOUS
  Filled 2016-04-03: qty 2

## 2016-04-03 MED ORDER — CITALOPRAM HYDROBROMIDE 20 MG PO TABS
20.0000 mg | ORAL_TABLET | Freq: Every day | ORAL | Status: DC
  Administered 2016-04-03: 20 mg via ORAL
  Filled 2016-04-03: qty 1

## 2016-04-03 MED ORDER — CEFAZOLIN SODIUM-DEXTROSE 2-4 GM/100ML-% IV SOLN
2.0000 g | INTRAVENOUS | Status: AC
  Administered 2016-04-04: 2 g via INTRAVENOUS
  Filled 2016-04-03: qty 100

## 2016-04-03 MED ORDER — MORPHINE SULFATE (PF) 2 MG/ML IV SOLN
4.0000 mg | Freq: Once | INTRAVENOUS | Status: AC
  Administered 2016-04-03: 4 mg via INTRAVENOUS

## 2016-04-03 NOTE — ED Notes (Signed)
Report given to [place] in same day surgrey

## 2016-04-03 NOTE — ED Triage Notes (Signed)
Pt c/o lower abd cramping with vaginal bleeding since Saturday and had a biopsy yesterday and since the bleeding and pain is worse. Was instructed to come to the ED for further eval.

## 2016-04-03 NOTE — H&P (Signed)
Consult History and Physical   SERVICE: Gynecology   Patient Name: Christine Carroll Patient MRN:   [id number]  CC: Menorrhagia  HPI: KYMM SOCK is a 55 y.o. para 2 with hx of C/S x2 admitted for heavy  Menstrual bleeding. Filling overnight super pad q2 hrs now for several days, with significant pain and cramping. She was seen in the office yesterday and EMBx is pending. Pap smear up to date.  Ultrasound today in ER with small fibroids, thickened endometrial stripe and small hyperechoic area at the fundus. Normal ovaries.  Review of Systems: positives in bold GEN:   fevers, chills, weight changes, appetite changes, fatigue, night sweats HEENT:  HA, vision changes, hearing loss, congestion, rhinorrhea, sinus pressure, dysphagia CV:   CP, palpitations PULM:  SOB, cough GI:  abd pain, N/V/D/C GU:  dysuria, urgency, frequency MSK:  arthralgias, myalgias, back pain, swelling SKIN:  rashes, color changes, pallor NEURO:  numbness, weakness, tingling, seizures, dizziness, tremors PSYCH:  depression, anxiety, behavioral problems, confusion  HEME/LYMPH:  easy bruising or bleeding ENDO:  heat/cold intolerance  Past Obstetrical History: OB History    No data available      Past Gynecologic History: No LMP recorded. Patient is not currently having periods (Reason: Perimenopausal).  Past Medical History: Past Medical History:  Diagnosis Date  . Depression   . Diabetes mellitus without complication ([place])   . Herpetic whitlow   . Hypertension   . Migraines   . Obesity     Past Surgical History:   Past Surgical History:  Procedure Laterality Date  . cesarean    . CHOLECYSTECTOMY    . COLONOSCOPY WITH PROPOFOL N/A 10/14/2015   Procedure: COLONOSCOPY WITH PROPOFOL;  Surgeon: Manya Silvas, MD;  Location: [facility] ENDOSCOPY;  Service: Endoscopy;  Laterality: N/A;  . TUBAL LIGATION      Family History:  family history includes Breast cancer in her maternal aunt.  Social  History:  Social History   Social History  . Marital status: Divorced    Spouse name: N/A  . Number of children: N/A  . Years of education: N/A   Occupational History  . Not on file.   Social History Main Topics  . Smoking status: Never Smoker  . Smokeless tobacco: Never Used  . Alcohol use No  . Drug use: No  . Sexual activity: Not on file   Other Topics Concern  . Not on file   Social History Narrative  . No narrative on file    Home Medications:  Medications reconciled in EPIC  No current facility-administered medications on file prior to encounter.    Current Outpatient Prescriptions on File Prior to Encounter  Medication Sig Dispense Refill  . aspirin EC 81 MG tablet Take 81 mg by mouth daily.    . Black Cohosh 20 MG TABS Take 20 mg by mouth.    . calcium carbonate (TUMS - DOSED IN MG ELEMENTAL CALCIUM) 500 MG chewable tablet Chew 1 tablet by mouth daily. Reported on 10/14/2015    . citalopram (CELEXA) 40 MG tablet Take 40 mg by mouth daily.    . Cranberry 400 MG CAPS Take 400 capsules by mouth.    . ergocalciferol (VITAMIN D2) 50000 units capsule Take 50,000 Units by mouth once a week.    . estradiol (CLIMARA - DOSED IN MG/24 HR) 0.0375 mg/24hr patch Place 0.0375 mg onto the skin once a week.    . loratadine (CLARITIN) 10 MG tablet Take 10 mg by  mouth daily.    Marland Kitchen LORazepam (ATIVAN) 0.5 MG tablet Take 0.5 mg by mouth every 8 (eight) hours as needed for anxiety.    . medroxyPROGESTERone (PROVERA) 10 MG tablet Take 10 mg by mouth daily.    Marland Kitchen nystatin [facility]) powder Apply topically 3 (three) times daily.    . vitamin B-12 (CYANOCOBALAMIN) 1000 MCG tablet Take 1,000 mcg by mouth daily.      Allergies:  Allergies  Allergen Reactions  . Vicodin [Hydrocodone-Acetaminophen] Nausea And Vomiting    Physical Exam:  Temp:  [98 F (36.7 C)] 98 F (36.7 C) (11/07 0716) Pulse Rate:  [72-77] 72 (11/07 1000) Resp:  [17] 17 (11/07 0716) BP: (122-158)/(77-94) 158/94 (11/07  1000) SpO2:  [93 %-96 %] 93 % (11/07 1000) Weight:  [222 lb (100.7 kg)] 222 lb (100.7 kg) (11/07 0717)   General Appearance:  Well developed, well nourished, no acute distress, alert and oriented x3 HEENT:  Normocephalic atraumatic, extraocular movements intact, moist mucous membranes Cardiovascular:  Normal S1/S2, regular rate and rhythm, no murmurs Pulmonary:  clear to auscultation, no wheezes, rales or rhonchi, symmetric air entry, good air exchange Abdomen:  Bowel sounds present, soft, nontender, nondistended, no abnormal masses, no epigastric pain Extremities:  Full range of motion, no pedal edema, 2+ distal pulses, no tenderness Skin:  normal coloration and turgor, no rashes, no suspicious skin lesions noted  Neurologic:  Cranial nerves 2-12 grossly intact, normal muscle tone, strength 5/5 all four extremities Psychiatric:  Normal mood and affect, appropriate, no AH/VH Pelvic:  Deferred- from visit yesterday, good pelvic floor support, tender uterus, diffusely tender abdomen.   Labs/Studies:   CBC and Coags:  Lab Results  Component Value Date   WBC 9.5 04/03/2016   NEUTOPHILPCT 61 04/03/2016   EOSPCT 3 04/03/2016   BASOPCT 1 04/03/2016   LYMPHOPCT 28 04/03/2016   HGB 13.8 04/03/2016   HCT 40.0 04/03/2016   MCV 88.0 04/03/2016   PLT 352 04/03/2016   CMP:  Lab Results  Component Value Date   NA 140 04/03/2016   K 3.9 04/03/2016   CL 107 04/03/2016   CO2 25 04/03/2016   BUN 13 04/03/2016   CREATININE 0.75 04/03/2016   PROT 7.0 04/03/2016   BILITOT 0.2 (L) 04/03/2016   ALT 16 04/03/2016   AST 20 04/03/2016   ALKPHOS 69 04/03/2016    Other Imaging: US Pelvis Complete  Result Date: 04/03/2016 CLINICAL DATA:  Four days of vaginal bleeding. Uterine biopsy yesterday. The patient is perimenopausal and not on hormone replacement therapy. EXAM: TRANSABDOMINAL AND TRANSVAGINAL ULTRASOUND OF PELVIS TECHNIQUE: Both transabdominal and transvaginal ultrasound examinations of  the pelvis were performed. Transabdominal technique was performed for global imaging of the pelvis including uterus, ovaries, adnexal regions, and pelvic cul-de-sac. It was necessary to proceed with endovaginal exam following the transabdominal exam to visualize the uterus, endometrium, ovaries, and adnexal regions. COMPARISON:  None in PACs FINDINGS: Uterus Measurements: 10.2 x 5.4 x 7.0 cm. The uterine echotexture is heterogeneous. There is a focal hypoechoic sub endometrial region measuring 1.8 x 1.2 x 1.5 cm that may extend into the endometrium. There is a typical appearing fibroid in the anterior lateral aspect of the uterine fundus measuring 1.3 cm in diameter. Endometrium Thickness: 7 mm.  No definite focal abnormality observed. Right ovary Measurements: 2.2 x 1.4 x 1.6 cm. Normal appearance/no adnexal mass. Left ovary The left ovary was obscured by gas filled bowel. Other findings No abnormal free fluid. IMPRESSION: Heterogeneous echotexture of the  myometrium with an area of decreased echogenicity in the subendometrial region that may communicate with the endometrium. Correlation with findings from the patient's endometrial biopsy is needed. The ultrasonic findings today could be related to post biopsy change or could be unbiopsied and reflect malignancy. If yesterday's endometrial biopsy results are benign, sonohysterogram should be considered for focal lesion work-up prior to hysteroscopy. (Ref: Radiological Reasoning: Algorithmic Workup of Abnormal Vaginal Bleeding with Endovaginal Sonography and Sonohysterography. AJR 2008; [id number]) Nonvisualization of the left ovary. Normal appearance of the right ovary. Electronically Signed   By: David  Martinique M.D.   On: 04/03/2016 09:34   Mm Diag Breast Tomo Bilateral  Result Date: 03/20/2016 CLINICAL DATA:  Diagnostic mammogram/ultrasound report of 02/15/2015 described probably benign masses within the left breast at the 2:30 o'clock axis, 10 cm and 11 cm  from the nipple, at which time a follow-up diagnostic examination was recommended in 6 months.Patient returns today for that follow-up exam. EXAM: 2D DIGITAL DIAGNOSTIC BILATERAL MAMMOGRAM WITH CAD AND ADJUNCT TOMO COMPARISON:  Previous exam(s). ACR Breast Density Category c: The breast tissue is heterogeneously dense, which may obscure small masses. FINDINGS: There are no new dominant masses, suspicious calcifications or secondary signs of malignancy within either breast. The oval circumscribed and partially obscured masses within the upper-outer quadrant of the left breast, at posterior depth, appear stable mammographically. Mammographic images were processed with CAD. IMPRESSION: Stable probably benign masses within the upper-outer quadrant of the left breast, at posterior depth, most likely benign cysts based on previous ultrasound appearance. Findings are stable mammographically for over 18 months. Recommend additional follow-up left breast diagnostic mammogram in 6 months to complete a 2 year follow-up protocol and thereby prove benignity, with expectation to then return to a routine annual bilateral screening mammogram schedule. RECOMMENDATION: Follow-up left breast diagnostic mammogram, and possible ultrasound, in 6 months. I have discussed the findings and recommendations with the patient. Results were also provided in writing at the conclusion of the visit. If applicable, a reminder letter will be sent to the patient regarding the next appointment. BI-RADS CATEGORY  3: Probably benign. Electronically Signed   By: Franki Cabot M.D.   On: 03/20/2016 13:08     Assessment / Plan:   RYLEA RINNER is a 55 y.o.  Para 2 who presents with heavy and worsening uterine bleeding, with fibroids and a thickened endometrial stripe.  1.AUB: Ddx includes malignancy, ovarian, adenomyosis, fibroids. I have offered the patient high dose estrogen or lysteda to temporize her bleeding.  I have offered the patient a  D&C even without the results of the EMBx because of the severity of her sx. She is interested in definitve surgery and has requested a hysterectomy. Her labs and vitals are normal today. Because of the pain and volume of bleeding currently, definitive surgery is reasonable. I will plan for TLH/BS and because of prior C/S, risks of open procedure discussed with patient.  She is aware that if malignancy were found- and that this is a distinct possibility- she may need a second procedure.  She is aware of the risks/benefit and alternatives as above. Risks of surgery including bleeding which may require transfusion or reoperation, infection, injury to bowel or other surrounding organs, need for additional procedures including (laparoscopy or) laparotomy were explained to patient and written informed consent was obtained.  Patient has will be admitted for bleeding stabilization overnight and go as add on tomorrow.    Anesthesia and OR aware.  Preoperative prophylactic antibiotics and  SCDs ordered on call to the OR.

## 2016-04-03 NOTE — ED Provider Notes (Signed)
[facility] Emergency Department Provider Note        Time seen: ----------------------------------------- 7:25 AM on 04/03/2016 -----------------------------------------    I have reviewed the triage vital signs and the nursing notes.   HISTORY  Chief Complaint Vaginal Bleeding    HPI Christine Carroll is a 55 y.o. female who presents to ER for lower abdominal cramping with vaginal bleeding since Saturday. Patient states yesterday she had a biopsy of her uterus done by Dr. Leafy Ro. Since then the bleeding has been worse, she has had some cramping but is not as severe currently. She states currently the bleeding has lessened slightly. She denies feeling weak or dizzy, has not heard the reports of the biopsy. She called the OB/GYN office and was told to come here for evaluation.   Past Medical History:  Diagnosis Date  . Depression   . Diabetes mellitus without complication ([place])   . Herpetic whitlow   . Hypertension   . Migraines   . Obesity     There are no active problems to display for this patient.   Past Surgical History:  Procedure Laterality Date  . cesarean    . CHOLECYSTECTOMY    . COLONOSCOPY WITH PROPOFOL N/A 10/14/2015   Procedure: COLONOSCOPY WITH PROPOFOL;  Surgeon: Manya Silvas, MD;  Location: [facility] ENDOSCOPY;  Service: Endoscopy;  Laterality: N/A;  . TUBAL LIGATION      Allergies Vicodin [hydrocodone-acetaminophen]  Social History Social History  Substance Use Topics  . Smoking status: Never Smoker  . Smokeless tobacco: Never Used  . Alcohol use No    Review of Systems Constitutional: Negative for fever. Cardiovascular: Negative for chest pain. Respiratory: Negative for shortness of breath. Gastrointestinal: Positive for abdominal cramping Genitourinary: Negative for dysuria.Positive for vaginal bleeding Musculoskeletal: Negative for back pain. Skin: Negative for rash. Neurological: Negative for headaches, focal  weakness or numbness.  10-point ROS otherwise negative.  ____________________________________________   PHYSICAL EXAM:  VITAL SIGNS: ED Triage Vitals  Enc Vitals Group     BP 04/03/16 0716 122/77     Pulse Rate 04/03/16 0716 77     Resp 04/03/16 0716 17     Temp 04/03/16 0716 98 F (36.7 C)     Temp Source 04/03/16 0716 Oral     SpO2 04/03/16 0716 96 %     Weight 04/03/16 0717 222 lb (100.7 kg)     Height 04/03/16 0717 5\' 4"  (1.626 m)     Head Circumference --      Peak Flow --      Pain Score 04/03/16 0717 2     Pain Loc --      Pain Edu? --      Excl. in [place]? --     Constitutional: Alert and oriented. Well appearing and in no distress. Eyes: Conjunctivae are normal. PERRL. Normal extraocular movements. ENT   Head: Normocephalic and atraumatic.   Nose: No congestion/rhinnorhea.   Mouth/Throat: Mucous membranes are moist.   Neck: No stridor. Cardiovascular: Normal rate, regular rhythm. No murmurs, rubs, or gallops. Respiratory: Normal respiratory effort without tachypnea nor retractions. Breath sounds are clear and equal bilaterally. No wheezes/rales/rhonchi. Gastrointestinal: Soft and nontender. Normal bowel sounds Genitourinary: Musculoskeletal: Nontender with normal range of motion in all extremities. No lower extremity tenderness nor edema. Neurologic:  Normal speech and language. No gross focal neurologic deficits are appreciated.  Skin:  Skin is warm, dry and intact. No rash noted. Psychiatric: Mood and affect are normal. Speech and  behavior are normal.  ____________________________________________  ED COURSE:  Pertinent labs & imaging results that were available during my care of the patient were reviewed by me and considered in my medical decision making (see chart for details). Clinical Course   Patient is no distress, we will assess with labs and discuss with OB/GYN.  Procedures  IMPRESSION: Heterogeneous echotexture of the myometrium with an  area of decreased echogenicity in the subendometrial region that may communicate with the endometrium. Correlation with findings from the patient's endometrial biopsy is needed. The ultrasonic findings today could be related to post biopsy change or could be unbiopsied and reflect malignancy. If yesterday's endometrial biopsy results are benign, sonohysterogram should be considered for focal lesion work-up prior to hysteroscopy. (Ref: Radiological Reasoning: Algorithmic Workup of Abnormal Vaginal Bleeding with Endovaginal Sonography and Sonohysterography. AJR 2008; [id number])  Nonvisualization of the left ovary. Normal appearance of the right ovary.  ____________________________________________   LABS (pertinent positives/negatives)  Labs Reviewed  COMPREHENSIVE METABOLIC PANEL - Abnormal; Notable for the following:       Result Value   Glucose, Bld 100 (*)    Calcium 8.7 (*)    Total Bilirubin 0.2 (*)    All other components within normal limits  CBC WITH DIFFERENTIAL/PLATELET  TYPE AND SCREEN  TYPE AND SCREEN  ABO/RH   ____________________________________________  FINAL ASSESSMENT AND PLAN  Abnormal vaginal bleeding  Plan: Patient with labs and imaging as dictated above. Patient's case has been discussed with OB/GYN on call. Dr. Leafy Ro has, and evaluated the patient in the ER. She will be admitted overnight and will likely have a hysterectomy tomorrow. Patient is agreeable to plan. I did order IV morphine for pain.   Earleen Newport, MD   Note: This dictation was prepared with Dragon dictation. Any transcriptional errors that result from this process are unintentional    Earleen Newport, MD 04/03/16 1510

## 2016-04-03 NOTE — ED Notes (Signed)
Patient transported to Ultrasound 

## 2016-04-03 NOTE — ED Notes (Signed)
Pt refused IV start at this time, pt states she wants to wait till shes upstairs so it can be numbed, offered pt pain-ezze spray but pt refused

## 2016-04-04 ENCOUNTER — Observation Stay: Payer: Managed Care, Other (non HMO) | Admitting: Registered Nurse

## 2016-04-04 ENCOUNTER — Encounter: Payer: Self-pay | Admitting: Anesthesiology

## 2016-04-04 ENCOUNTER — Encounter
Admission: EM | Disposition: A | Discharge status: Home / Self Care | Payer: Self-pay | Source: Home / Self Care | Attending: Obstetrics and Gynecology

## 2016-04-04 HISTORY — PX: LAPAROSCOPIC HYSTERECTOMY: SHX1926

## 2016-04-04 LAB — GLUCOSE, CAPILLARY
GLUCOSE-CAPILLARY: 76 mg/dL (ref 65–99)
GLUCOSE-CAPILLARY: 126 mg/dL — AB (ref 65–99)

## 2016-04-04 SURGERY — HYSTERECTOMY, TOTAL, LAPAROSCOPIC
Anesthesia: General

## 2016-04-04 MED ORDER — SUCCINYLCHOLINE CHLORIDE 20 MG/ML IJ SOLN
INTRAMUSCULAR | Status: DC | PRN
  Administered 2016-04-04: 100 mg via INTRAVENOUS

## 2016-04-04 MED ORDER — DEXAMETHASONE SODIUM PHOSPHATE 10 MG/ML IJ SOLN
INTRAMUSCULAR | Status: DC | PRN
  Administered 2016-04-04: 4 mg via INTRAVENOUS

## 2016-04-04 MED ORDER — DOCUSATE SODIUM 100 MG PO CAPS
100.0000 mg | ORAL_CAPSULE | Freq: Two times a day (BID) | ORAL | Status: DC
  Administered 2016-04-04 – 2016-04-05 (×2): 100 mg via ORAL
  Filled 2016-04-04 (×2): qty 1

## 2016-04-04 MED ORDER — FENTANYL CITRATE (PF) 100 MCG/2ML IJ SOLN
INTRAMUSCULAR | Status: DC | PRN
  Administered 2016-04-04: 100 ug via INTRAVENOUS
  Administered 2016-04-04 (×2): 50 ug via INTRAVENOUS
  Administered 2016-04-04: 25 ug via INTRAVENOUS
  Administered 2016-04-04: 50 ug via INTRAVENOUS

## 2016-04-04 MED ORDER — ONDANSETRON HCL 4 MG/2ML IJ SOLN
INTRAMUSCULAR | Status: DC | PRN
Start: 2016-04-04 — End: 2016-04-04
  Administered 2016-04-04: 4 mg via INTRAVENOUS

## 2016-04-04 MED ORDER — CITALOPRAM HYDROBROMIDE 20 MG PO TABS
40.0000 mg | ORAL_TABLET | Freq: Every day | ORAL | Status: DC
  Administered 2016-04-04 – 2016-04-05 (×2): 40 mg via ORAL
  Filled 2016-04-04 (×2): qty 2

## 2016-04-04 MED ORDER — CELECOXIB 200 MG PO CAPS
200.0000 mg | ORAL_CAPSULE | Freq: Two times a day (BID) | ORAL | Status: DC
  Administered 2016-04-04 – 2016-04-05 (×2): 200 mg via ORAL
  Filled 2016-04-04 (×2): qty 1

## 2016-04-04 MED ORDER — NEOSTIGMINE METHYLSULFATE 10 MG/10ML IV SOLN
INTRAVENOUS | Status: DC | PRN
  Administered 2016-04-04: 5 mg via INTRAVENOUS

## 2016-04-04 MED ORDER — OXYCODONE HCL 5 MG PO CAPS: 5.0000 mg | ORAL_CAPSULE | Freq: Four times a day (QID) | ORAL | 0 refills | Status: AC | PRN

## 2016-04-04 MED ORDER — PROPOFOL 10 MG/ML IV BOLUS
INTRAVENOUS | Status: DC | PRN
  Administered 2016-04-04: 180 mg via INTRAVENOUS

## 2016-04-04 MED ORDER — MENTHOL 3 MG MT LOZG
1.0000 | LOZENGE | OROMUCOSAL | Status: DC | PRN
  Filled 2016-04-04: qty 9

## 2016-04-04 MED ORDER — GABAPENTIN 800 MG PO TABS: 800.0000 mg | ORAL_TABLET | Freq: Every day | ORAL | 0 refills | Status: DC

## 2016-04-04 MED ORDER — LACTATED RINGERS IV SOLN: INTRAVENOUS | Status: DC

## 2016-04-04 MED ORDER — ONDANSETRON HCL 4 MG/2ML IJ SOLN
4.0000 mg | Freq: Four times a day (QID) | INTRAMUSCULAR | Status: DC | PRN
Start: 2016-04-04 — End: 2016-04-05

## 2016-04-04 MED ORDER — ONDANSETRON HCL 4 MG/2ML IJ SOLN: 4.0000 mg | Freq: Once | INTRAMUSCULAR | Status: DC | PRN

## 2016-04-04 MED ORDER — ONDANSETRON HCL 4 MG PO TABS: 4.0000 mg | ORAL_TABLET | Freq: Four times a day (QID) | ORAL | Status: DC | PRN

## 2016-04-04 MED ORDER — MIDAZOLAM HCL 2 MG/2ML IJ SOLN
INTRAMUSCULAR | Status: DC | PRN
  Administered 2016-04-04: 2 mg via INTRAVENOUS

## 2016-04-04 MED ORDER — FENTANYL CITRATE (PF) 100 MCG/2ML IJ SOLN
INTRAMUSCULAR | Status: AC
  Administered 2016-04-04: 25 ug via INTRAVENOUS
  Filled 2016-04-04: qty 2

## 2016-04-04 MED ORDER — LIDOCAINE HCL (CARDIAC) 20 MG/ML IV SOLN
INTRAVENOUS | Status: DC | PRN
  Administered 2016-04-04: 100 mg via INTRAVENOUS

## 2016-04-04 MED ORDER — CELECOXIB 200 MG PO CAPS: 200.0000 mg | ORAL_CAPSULE | Freq: Two times a day (BID) | ORAL | 0 refills | Status: AC

## 2016-04-04 MED ORDER — ROCURONIUM BROMIDE 100 MG/10ML IV SOLN
INTRAVENOUS | Status: DC | PRN
  Administered 2016-04-04: 10 mg via INTRAVENOUS
  Administered 2016-04-04: 5 mg via INTRAVENOUS
  Administered 2016-04-04 (×2): 10 mg via INTRAVENOUS
  Administered 2016-04-04: 30 mg via INTRAVENOUS
  Administered 2016-04-04: 10 mg via INTRAVENOUS

## 2016-04-04 MED ORDER — HYDROCHLOROTHIAZIDE 12.5 MG PO CAPS
12.5000 mg | ORAL_CAPSULE | Freq: Every day | ORAL | Status: DC
Start: 2016-04-04 — End: 2016-04-05
  Administered 2016-04-04: 12.5 mg via ORAL
  Filled 2016-04-04: qty 1

## 2016-04-04 MED ORDER — ACETAMINOPHEN 500 MG PO TABS
1000.0000 mg | ORAL_TABLET | Freq: Four times a day (QID) | ORAL | Status: DC
  Administered 2016-04-04 – 2016-04-05 (×3): 1000 mg via ORAL
  Filled 2016-04-04 (×4): qty 2

## 2016-04-04 MED ORDER — ACETAMINOPHEN 500 MG PO TABS: 1000.0000 mg | ORAL_TABLET | Freq: Four times a day (QID) | ORAL | 0 refills | Status: AC

## 2016-04-04 MED ORDER — BUPIVACAINE HCL 0.5 % IJ SOLN
INTRAMUSCULAR | Status: DC | PRN
  Administered 2016-04-04: 15 mL

## 2016-04-04 MED ORDER — ONDANSETRON HCL 4 MG PO TABS: 4.0000 mg | ORAL_TABLET | Freq: Four times a day (QID) | ORAL | 0 refills | Status: DC | PRN

## 2016-04-04 MED ORDER — NYSTATIN 100000 UNIT/GM EX POWD
Freq: Three times a day (TID) | CUTANEOUS | Status: DC
  Filled 2016-04-04: qty 15

## 2016-04-04 MED ORDER — ACETAMINOPHEN 10 MG/ML IV SOLN
INTRAVENOUS | Status: DC | PRN
  Administered 2016-04-04: 1000 mg via INTRAVENOUS

## 2016-04-04 MED ORDER — FENTANYL CITRATE (PF) 100 MCG/2ML IJ SOLN
25.0000 ug | INTRAMUSCULAR | Status: DC | PRN
  Administered 2016-04-04 (×4): 25 ug via INTRAVENOUS

## 2016-04-04 MED ORDER — PHENYLEPHRINE HCL 10 MG/ML IJ SOLN
INTRAMUSCULAR | Status: DC | PRN
  Administered 2016-04-04: 100 ug via INTRAVENOUS

## 2016-04-04 MED ORDER — GABAPENTIN 300 MG PO CAPS
900.0000 mg | ORAL_CAPSULE | Freq: Every day | ORAL | Status: DC
  Administered 2016-04-04: 900 mg via ORAL
  Filled 2016-04-04: qty 3

## 2016-04-04 MED ORDER — GLYCOPYRROLATE 0.2 MG/ML IJ SOLN
INTRAMUSCULAR | Status: DC | PRN
  Administered 2016-04-04: 1 mg via INTRAVENOUS

## 2016-04-04 MED ORDER — HYDROMORPHONE HCL 1 MG/ML IJ SOLN: 0.2000 mg | INTRAMUSCULAR | Status: DC | PRN

## 2016-04-04 SURGICAL SUPPLY — 72 items
BAG URO DRAIN 2000ML W/SPOUT (MISCELLANEOUS) IMPLANT
BLADE SURG SZ11 CARB STEEL (BLADE) ×3 IMPLANT
CANISTER SUCT 1200ML W/VALVE (MISCELLANEOUS) ×3 IMPLANT
CATH FOLEY 2WAY  5CC 16FR (CATHETERS) ×2
CATH TRAY 16F METER LATEX (MISCELLANEOUS) ×3 IMPLANT
CATH URTH 16FR FL 2W BLN LF (CATHETERS) ×1 IMPLANT
CHLORAPREP W/TINT 26ML (MISCELLANEOUS) ×3 IMPLANT
CORD MONOPOLAR M/FML 12FT (MISCELLANEOUS) IMPLANT
DEFOGGER SCOPE WARMER CLEARIFY (MISCELLANEOUS) ×3 IMPLANT
DERMABOND ADVANCED (GAUZE/BANDAGES/DRESSINGS) ×2
DERMABOND ADVANCED .7 DNX12 (GAUZE/BANDAGES/DRESSINGS) ×1 IMPLANT
DEVICE SUTURE ENDOST 10MM (ENDOMECHANICALS) IMPLANT
DRAPE LAP W/FLUID (DRAPES) ×3 IMPLANT
DRAPE UNDER BUTTOCK W/FLU (DRAPES) ×3 IMPLANT
DRSG TEGADERM 2-3/8X2-3/4 SM (GAUZE/BANDAGES/DRESSINGS) ×9 IMPLANT
DRSG TELFA 3X8 NADH (GAUZE/BANDAGES/DRESSINGS) ×3 IMPLANT
ELECT CAUTERY BLADE 6.4 (BLADE) ×3 IMPLANT
ELECT REM PT RETURN 9FT ADLT (ELECTROSURGICAL) ×3
ELECTRODE REM PT RTRN 9FT ADLT (ELECTROSURGICAL) ×1 IMPLANT
ENDOSTITCH 0 SINGLE 48 (SUTURE) IMPLANT
GAUZE SPONGE 4X4 12PLY STRL (GAUZE/BANDAGES/DRESSINGS) ×3 IMPLANT
GAUZE SPONGE NON-WVN 2X2 STRL (MISCELLANEOUS) ×2 IMPLANT
GLOVE BIO SURGEON STRL SZ7 (GLOVE) ×12 IMPLANT
GLOVE INDICATOR 7.5 STRL GRN (GLOVE) ×9 IMPLANT
GOWN STRL REUS W/ TWL LRG LVL3 (GOWN DISPOSABLE) ×2 IMPLANT
GOWN STRL REUS W/ TWL XL LVL3 (GOWN DISPOSABLE) ×1 IMPLANT
GOWN STRL REUS W/TWL LRG LVL3 (GOWN DISPOSABLE) ×4
GOWN STRL REUS W/TWL XL LVL3 (GOWN DISPOSABLE) ×2
IRRIGATION STRYKERFLOW (MISCELLANEOUS) ×1 IMPLANT
IRRIGATOR STRYKERFLOW (MISCELLANEOUS) ×3
KIT RM TURNOVER CYSTO AR (KITS) ×3 IMPLANT
L-HOOK LAP DISP 36CM (ELECTROSURGICAL) ×3
LABEL OR SOLS (LABEL) ×3 IMPLANT
LHOOK LAP DISP 36CM (ELECTROSURGICAL) ×1 IMPLANT
LIGASURE BLUNT 5MM 37CM (INSTRUMENTS) ×3 IMPLANT
LIQUID BAND (GAUZE/BANDAGES/DRESSINGS) ×3 IMPLANT
MANIPULATOR VCARE LG CRV RETR (MISCELLANEOUS) IMPLANT
MANIPULATOR VCARE SML CRV RETR (MISCELLANEOUS) IMPLANT
MANIPULATOR VCARE STD CRV RETR (MISCELLANEOUS) ×3 IMPLANT
NEEDLE VERESS 14GA 120MM (NEEDLE) ×3 IMPLANT
NS IRRIG 500ML POUR BTL (IV SOLUTION) ×3 IMPLANT
OCCLUDER COLPOPNEUMO (BALLOONS) ×6 IMPLANT
PACK BASIN MAJOR ARMC (MISCELLANEOUS) ×3 IMPLANT
PACK GYN LAPAROSCOPIC (MISCELLANEOUS) ×3 IMPLANT
PAD OB MATERNITY 4.3X12.25 (PERSONAL CARE ITEMS) ×3 IMPLANT
PAD PREP 24X41 OB/GYN DISP (PERSONAL CARE ITEMS) ×3 IMPLANT
PENCIL ELECTRO HAND CTR (MISCELLANEOUS) ×3 IMPLANT
SCISSORS METZENBAUM CVD 33 (INSTRUMENTS) ×3 IMPLANT
SET CYSTO W/LG BORE CLAMP LF (SET/KITS/TRAYS/PACK) IMPLANT
SLEEVE ENDOPATH XCEL 5M (ENDOMECHANICALS) ×6 IMPLANT
SOL PREP PVP 2OZ (MISCELLANEOUS) ×3
SOLUTION PREP PVP 2OZ (MISCELLANEOUS) ×1 IMPLANT
SPONGE LAP 18X18 5 PK (GAUZE/BANDAGES/DRESSINGS) IMPLANT
SPONGE VERSALON 2X2 STRL (MISCELLANEOUS) ×4
SPONGE XRAY 4X4 16PLY STRL (MISCELLANEOUS) ×6 IMPLANT
STAPLER SKIN PROX 35W (STAPLE) IMPLANT
SURGILUBE 2OZ TUBE FLIPTOP (MISCELLANEOUS) ×3 IMPLANT
SUT VIC AB 0 CT1 27 (SUTURE)
SUT VIC AB 0 CT1 27XCR 8 STRN (SUTURE) IMPLANT
SUT VIC AB 0 CT1 36 (SUTURE) ×6 IMPLANT
SUT VIC AB 2-0 SH 27 (SUTURE)
SUT VIC AB 2-0 SH 27XBRD (SUTURE) IMPLANT
SUT VIC AB 2-0 UR6 27 (SUTURE) IMPLANT
SUT VICRYL PLUS ABS 0 54 (SUTURE) IMPLANT
SYR 50ML LL SCALE MARK (SYRINGE) ×3 IMPLANT
SYR BULB IRRIG 60ML STRL (SYRINGE) IMPLANT
SYRINGE 10CC LL (SYRINGE) ×3 IMPLANT
TRAY PREP VAG/GEN (MISCELLANEOUS) ×3 IMPLANT
TROCAR ENDO BLADELESS 11MM (ENDOMECHANICALS) ×3 IMPLANT
TROCAR XCEL NON-BLD 5MMX100MML (ENDOMECHANICALS) ×3 IMPLANT
TUBING INSUFFLATOR HEATED (MISCELLANEOUS) ×3 IMPLANT
WATER STERILE IRR 1000ML POUR (IV SOLUTION) ×3 IMPLANT

## 2016-04-04 NOTE — Anesthesia Procedure Notes (Signed)
Procedure Name: Intubation Performed by: Roslin Norwood Pre-anesthesia Checklist: Patient identified, Patient being monitored, Timeout performed, Emergency Drugs available and Suction available Patient Re-evaluated:Patient Re-evaluated prior to inductionOxygen Delivery Method: Circle system utilized Preoxygenation: Pre-oxygenation with 100% oxygen Intubation Type: IV induction Ventilation: Mask ventilation without difficulty Laryngoscope Size: Mac and 3 Grade View: Grade I Tube type: Oral Tube size: 7.0 mm Number of attempts: 1 Airway Equipment and Method: Stylet Placement Confirmation: ETT inserted through vocal cords under direct vision,  positive ETCO2 and breath sounds checked- equal and bilateral Secured at: 19 cm Tube secured with: Tape Dental Injury: Teeth and Oropharynx as per pre-operative assessment        

## 2016-04-04 NOTE — Interval H&P Note (Signed)
History and Physical Interval Note:  04/04/2016 12:03 PM  Christine Carroll  has presented today for surgery, with the diagnosis of POST MENOPAUSAL BLEEDING.  She was admitted into the hospital overnight for control of her bleeding and pain after arriving for evaluation in the ER last night. She was added onto the OR schedule. The reading pathologist of the office [facility] from 2 days ago was contacted and confirmed that our sample did not contain malignancy. A D&C would be a more thorough sample, and this was offered to her again. However, she confirms her desire for definitive surgery, and we will move forward with a TLH, BS.  The various methods of treatment have been discussed with the patient and family. After consideration of risks, benefits and other options for treatment, the patient has consented to  Procedure(s): HYSTERECTOMY TOTAL LAPAROSCOPIC/ BILATERAL SALPINGECTOMY (N/A) HYSTERECTOMY ABDOMINAL WITH SALPINGECTOMY (N/A) as a surgical intervention .  The patient's history has been reviewed, patient examined, no change in status, stable for surgery.  I have reviewed the patient's chart and labs.  Questions were answered to the patient's satisfaction.     [name]

## 2016-04-04 NOTE — Transfer of Care (Signed)
Immediate Anesthesia Transfer of Care Note  Patient: Christine Carroll  Procedure(s) Performed: Procedure(s): HYSTERECTOMY TOTAL LAPAROSCOPIC/ BILATERAL SALPINGECTOMY (N/A)  Patient Location: PACU  Anesthesia Type:General  Level of Consciousness: sedated and responds to stimulation  Airway & Oxygen Therapy: Patient Spontanous Breathing and Patient connected to face mask oxygen  Post-op Assessment: Report given to RN and Post -op Vital signs reviewed and stable  Post vital signs: Reviewed and stable  Last Vitals:  Vitals:   04/04/16 1142 04/04/16 1511  BP: (!) 142/76 (!) 126/59  Pulse: 81 99  Resp: 16   Temp: 36.7 C     Last Pain:  Vitals:   04/04/16 1142  TempSrc: Tympanic  PainSc:          Complications: No apparent anesthesia complications

## 2016-04-04 NOTE — Progress Notes (Signed)
Day of Surgery Procedure(s) (LRB): HYSTERECTOMY TOTAL LAPAROSCOPIC/ BILATERAL SALPINGECTOMY (N/A)  Subjective: Patient reports incisional pain.    Objective: I have reviewed patient's vital signs, intake and output and medications.  General: alert, cooperative, fatigued and no distress GI: soft, non-tender; bowel sounds normal; no masses,  no organomegaly and incision: clean and dry Extremities: extremities normal, atraumatic, no cyanosis or edema  Assessment: s/p Procedure(s): HYSTERECTOMY TOTAL LAPAROSCOPIC/ BILATERAL SALPINGECTOMY (N/A): stable  Plan: Advance diet Encourage ambulation Advance to PO medication  LOS: 1 day    Christine Carroll 04/04/2016, 5:12 PM

## 2016-04-04 NOTE — Op Note (Signed)
Christine Carroll PROCEDURE DATE: 04/03/2016 - 04/04/2016  PREOPERATIVE DIAGNOSIS:  POSTOPERATIVE DIAGNOSIS: The same PROCEDURE: Lysis of adhesions, total laparoscopic hysterectomy with vaginal closure, bilateral salpingectomy, left oopherectomy SURGEON:  Dr. [name] ASSISTANT: Dr. Vikki Ports Ward Anesthesiologist: Gunnar Bulla, MD Anesthesiologist: Gunnar Bulla, MD CRNA: Doreen Salvage, CRNA; Lance Muss, CRNA  INDICATIONS: 55 y.o. para 2 with hx of C/S x2 and postmenopausal bleeding and pelvic pain  here for definitive surgical management secondary to the indications listed under preoperative diagnoses; please see preoperative note for further details.  Risks of surgery were discussed with the patient including but not limited to: bleeding which may require transfusion or reoperation; infection which may require antibiotics; injury to bowel, bladder, ureters or other surrounding organs; need for additional procedures; thromboembolic phenomenon, incisional problems and other postoperative/anesthesia complications. Written informed consent was obtained.    FINDINGS:  Anterior midline omental adhesions that were taking down prior to the start of pelvic portion of the case. Anterior adhesions between the uterus and bladder. Normal uterus, bilateral tubes and right ovary. Left ovary with complex-appearing cyst. Normal appendix. Normal upper abdomen.   ANESTHESIA:    General INTRAVENOUS FLUIDS:1200  ml ESTIMATED BLOOD LOSS:20 ml URINE OUTPUT: 235 ml   SPECIMENS: Uterus, cervix, bilateral fallopian tubes, left ovary. COMPLICATIONS: None immediate  PROCEDURE IN DETAIL:  The patient received prophalactic intravenous antibiotics and had sequential compression devices applied to her lower extremities while in the preoperative area.  She was then taken to the operating room where general anesthesia was administered and was found to be adequate.  She was placed in the dorsal lithotomy position, and was  prepped and draped in a sterile manner.  A formal time out was performed with all team members present and in agreement.  A V-care uterine manipulator was placed at this time.  A Foley catheter was inserted into her bladder and attached to constant drainage. Attention was turned to the abdomen and 0.5% Marcaine infused subq. A 80mm umbilical incision was made with the scalpel.  The Optiview 5-mm trocar and sleeve were then advanced without difficulty with the laparoscope under direct visualization into the abdomen.  The abdomen was then insufflated with carbon dioxide gas and adequate pneumoperitoneum was obtained.  A survey of the patient's pelvis and abdomen revealed the findings above.  Bilateral lower quadrant ports (5 mm on the right and 5 mm on the left) were then placed under direct visualization.   Omental adhesions were taken down using the bipolar Ligasure device, after careful examination to be certain the bowel was not adherent.    The pelvis was then carefully examined.  Attention was turned to the fallopian tubes; these were freed from the underlying mesosalpinx and the uterine attachments using the Ligasure device.  The bilateral round and broad ligaments were then clamped and transected with the Ligasure device.  Attention was turned to the left IP ligament. This was fulgurated and ligated, freeing the left ovary from the pelvic sidewall.  The uterine artery was then skeletonized and a bladder flap was created.  The ureters were noted to be safely away from the area of dissection.  The bladder was then bluntly dissected off the lower uterine segment.    At this point, attention was turned to the uterine vessels, which were clamped and cauterized using the Ligasure on the left, and then the right. After the uterine blood flow at the level of the internal os was controlled, both arteries were cut with the Ligasure.  Good hemostasis was noted overall.  The uterosacral and cardinal ligaments  were clamped, cut and ligated bilaterally .  Attention was then turned to the cervicovaginal junction, and monopolar L-hook was used to transect the cervix from the surrounding vagina using the ring of the V-care as a guide. This was done circumferentially allowing total hysterectomy.  The uterus was then removed from the vagina and the vaginal cuff incision was then closed with running 0 Vicryl from below.  Overall excellent hemostasis was noted.    Attention was returned to the abdomen.The ureters were reexamined bilaterally and were pulsating normally. The abdominal pressure was reduced and hemostasis was confirmed.  All trocars were removed under direct visualization, and the abdomen was desufflated.  All skin incisions were closed with 4-0 Vicryl subcuticular stitches and Dermabond. The patient tolerated the procedures well.  All instruments, needles, and sponge counts were correct x 2. The patient was taken to the recovery room awake, extubated and in stable condition.

## 2016-04-04 NOTE — Anesthesia Preprocedure Evaluation (Signed)
Anesthesia Evaluation  Patient identified by MRN, date of birth, ID band Patient awake    Reviewed: Allergy & Precautions, NPO status , Patient's Chart, lab work & pertinent test results, reviewed documented beta blocker date and time   Airway Mallampati: III  TM Distance: >3 FB     Dental  (+) Chipped   Pulmonary           Cardiovascular hypertension, Pt. on medications      Neuro/Psych  Headaches, PSYCHIATRIC DISORDERS Depression    GI/Hepatic   Endo/Other  diabetes, Type 2  Renal/GU      Musculoskeletal   Abdominal   Peds  Hematology   Anesthesia Other Findings Obese.  Reproductive/Obstetrics                             Anesthesia Physical Anesthesia Plan  ASA: III  Anesthesia Plan: General   Post-op Pain Management:    Induction: Intravenous  Airway Management Planned: Oral ETT  Additional Equipment:   Intra-op Plan:   Post-operative Plan:   Informed Consent: I have reviewed the patients History and Physical, chart, labs and discussed the procedure including the risks, benefits and alternatives for the proposed anesthesia with the patient or authorized representative who has indicated his/her understanding and acceptance.     Plan Discussed with: CRNA  Anesthesia Plan Comments:         Anesthesia Quick Evaluation

## 2016-04-04 NOTE — Anesthesia Postprocedure Evaluation (Signed)
Anesthesia Post Note  Patient: Christine Carroll  Procedure(s) Performed: Procedure(s) (LRB): HYSTERECTOMY TOTAL LAPAROSCOPIC/ BILATERAL SALPINGECTOMY (N/A)  Patient location during evaluation: PACU Anesthesia Type: General Level of consciousness: awake and alert Pain management: pain level controlled Vital Signs Assessment: post-procedure vital signs reviewed and stable Respiratory status: spontaneous breathing, nonlabored ventilation, respiratory function stable and patient connected to nasal cannula oxygen Cardiovascular status: blood pressure returned to baseline and stable Postop Assessment: no signs of nausea or vomiting Anesthetic complications: no    Last Vitals:  Vitals:   04/04/16 1603 04/04/16 1616  BP: 132/61 138/70  Pulse: 86 91  Resp: 19 20  Temp:  36.5 C    Last Pain:  Vitals:   04/04/16 1616  TempSrc:   PainSc: [place]

## 2016-04-05 ENCOUNTER — Encounter: Payer: Self-pay | Admitting: Obstetrics and Gynecology

## 2016-04-05 LAB — BASIC METABOLIC PANEL
ANION GAP: 5 (ref 5–15)
BUN: 10 mg/dL (ref 6–20)
CHLORIDE: 104 mmol/L (ref 101–111)
CO2: 29 mmol/L (ref 22–32)
Calcium: 8.4 mg/dL — ABNORMAL LOW (ref 8.9–10.3)
Creatinine, Ser: 0.85 mg/dL (ref 0.44–1.00)
GFR calc non Af Amer: >60 mL/min (ref >60)
Glucose, Bld: 105 mg/dL — ABNORMAL HIGH (ref 65–99)
Potassium: 3.7 mmol/L (ref 3.5–5.1)
SODIUM: 138 mmol/L (ref 135–145)

## 2016-04-05 LAB — CBC
HEMATOCRIT: 35.4 % (ref 35.0–47.0)
HEMOGLOBIN: 12 g/dL (ref 12.0–16.0)
MCH: 30.6 pg (ref 26.0–34.0)
MCHC: 34 g/dL (ref 32.0–36.0)
MCV: 90.1 fL (ref 80.0–100.0)
Platelets: 333 10*3/uL (ref 150–440)
RBC: 3.93 MIL/uL (ref 3.80–5.20)
RDW: 13.1 % (ref 11.5–14.5)
WBC: 13 10*3/uL — AB (ref 3.6–11.0)

## 2016-04-05 NOTE — Discharge Summary (Signed)
Physician Discharge Summary  Patient ID: Christine Carroll MRN: [id number] DOB/AGE: May 07, 1961 55 y.o.  Admit date: 04/03/2016 Discharge date: 04/05/2016  Admission Diagnoses:  Discharge Diagnoses:  Active Problems:   Abnormal uterine bleeding unrelated to menstrual cycle   Discharged Condition: good  Hospital Course: Admitted from overnight monitoring after total laparoscopic hysterectomy with LSO for AUB and pelvic pain. On POD#1, pt was ambulating without assistance, tolerating regular PO diet, voiding spontaneously, and her pain was controlled with oral medications.   Consults: None  Discharge Exam: Blood pressure (!) 114/52, pulse 75, temperature 98.7 F (37.1 C), temperature source Oral, resp. rate 16, height 5\' 4"  (1.626 m), weight 222 lb (100.7 kg), SpO2 96 %. General appearance: alert, cooperative and appears stated age Pulses: 2+ and symmetric Skin: Skin color, texture, turgor normal. No rashes or lesions Incision/Wound: clean, dry intact x3 abd  Disposition: 01-Home or Self Care     Medication List    TAKE these medications   acetaminophen 500 MG tablet Commonly known as:  TYLENOL Take 2 tablets (1,000 mg total) by mouth every 6 (six) hours.   aspirin EC 81 MG tablet Take 81 mg by mouth daily.   Black Cohosh 20 MG Tabs Take 20 mg by mouth.   celecoxib 200 MG capsule Commonly known as:  CELEBREX Take 1 capsule (200 mg total) by mouth 2 (two) times daily.   citalopram 40 MG tablet Commonly known as:  CELEXA Take 40 mg by mouth daily.   Cranberry 400 MG Caps Take 400 capsules by mouth.   ergocalciferol 50000 units capsule Commonly known as:  VITAMIN D2 Take 50,000 Units by mouth once a week.   gabapentin 800 MG tablet Commonly known as:  NEURONTIN Take 1 tablet (800 mg total) by mouth at bedtime.   hydrochlorothiazide 12.5 MG capsule Commonly known as:  MICROZIDE Take 12.5 mg by mouth daily.   NYAMYC powder Generic drug:  nystatin Apply  topically 3 (three) times daily.   ondansetron 4 MG tablet Commonly known as:  ZOFRAN Take 1 tablet (4 mg total) by mouth every 6 (six) hours as needed for nausea.   oxycodone 5 MG capsule Commonly known as:  OXY-IR Take 1 capsule (5 mg total) by mouth every 6 (six) hours as needed for pain.   vitamin B-12 1000 MCG tablet Commonly known as:  CYANOCOBALAMIN Take 1,000 mcg by mouth daily.      Follow-up Information    [name], MD In 2 weeks.   Specialty:  Obstetrics and Gynecology Why:  For postop check Contact information: [place] [address] [phone]           Signed: [name] 04/05/2016, 1:30 PM

## 2016-04-05 NOTE — Discharge Instructions (Signed)
Signs and Symptoms to Report Call our office at [phone] if you have any of the following.   Fever over 100.4 degrees or higher  Severe stomach pain not relieved with pain medications  Bright red bleeding thats heavier than a period that does not slow with rest  To go the bathroom a lot (frequency), you cant hold your urine (urgency), or it hurts when you empty your bladder (urinate)  Chest pain  Shortness of breath  Pain in the calves of your legs  Severe nausea and vomiting not relieved with anti-nausea medications  Signs of infection around your wounds, such as redness, hot to touch, swelling, green/yellow drainage (like pus), bad smelling discharge  Any concerns  What You Can Expect after Surgery  You may see some pink tinged, bloody fluid and bruising around the wound. This is normal.  You may notice shoulder and neck pain. This is caused by the gas used during surgery to expand your abdomen so your surgeon could get to the uterus easier.  You may have a sore throat because of the tube in your mouth during general anesthesia. This will go away in 2 to 3 days.  You may have some stomach cramps.  You may notice spotting on your panties.  You may have pain around the incision sites.   Activities after Your Discharge Follow these guidelines to help speed your recovery at home:  Do the coughing and deep breathing as you did in the hospital for 2 weeks. Use the small blue breathing device, called the incentive spirometer for 2 weeks.  Dont drive if you are in pain or taking narcotic pain medicine. You may drive when you can safely slam on the brakes, turn the wheel forcefully, and rotate your torso comfortably. This is typically 1-2 weeks. Practice in a parking lot or side street prior to attempting to drive regularly.   Ask others to help with household chores for 4 weeks.  Do not lift anything heavier that 10 pounds for 4-6 weeks. This includes pets,  children, and groceries.  Dont do strenuous activities, exercises, or sports like vacuuming, tennis, squash, etc. until your doctor says it is safe to do so. ---If you had a hysterectomy (abdominal, laparoscopic, or vaginal) do not have intercourse for 8-10 weeks.   Walk as you feel able. Rest often since it may take two or three weeks for your energy level to return to normal.   You may climb stairs  Avoid constipation:   -Eat fruits, vegetables, and whole grains. Eat small meals as your appetite will take time to return to normal.   -Drink 6 to 8 glasses of water each day unless your doctor has told you to limit your fluids.   -Use a laxative or stool softener as needed if constipation becomes a problem. You may take Miralax, metamucil, Citrucil, Colace, Senekot, FiberCon, etc. If this does not relieve the constipation, try two tablespoons of Milk Of Magnesia every 8 hours until your bowels move.   You may shower. Gently wash the wounds with a mild soap and water. Pat dry.  Do not get in a hot tub, swimming pool, etc. until your doctor agrees.  Do not use lotions, oils, powders on the wounds.  Do not douche, use tampons, or have sex until your doctor says it is okay.  Take your pain medicine when you need it. The medicine may not work as well if the pain is bad.  Take the medicines you were  taking before surgery. Other medications you will need are pain medications (Norco or Percocet) and nausea medications (Zofran).  ° °

## 2016-04-05 NOTE — Progress Notes (Signed)
Discharge Instructions reviewed with patient . Rx's given to patient. Patient verbalized understanding.Patient reminded to take take all belongings with patient upon departure. Patient's daughter picking up patient. Patient escorted to lobby via wheelchair.

## 2016-04-06 LAB — SURGICAL PATHOLOGY

## 2016-04-13 IMAGING — MG MM DIAG BREAST TOMO UNI LEFT
7 series · 7 of 15 positions shown · non-contrast
Comparison: 08/13/2014 and prior mammograms dating back to
07/14/2002

CLINICAL DATA: 54-year-old female -followup left breast masses.

EXAM:
DIGITAL DIAGNOSTIC LEFT MAMMOGRAM WITH 3D TOMOSYNTHESIS WITH CAD
ULTRASOUND LEFT BREAST

[L CC]
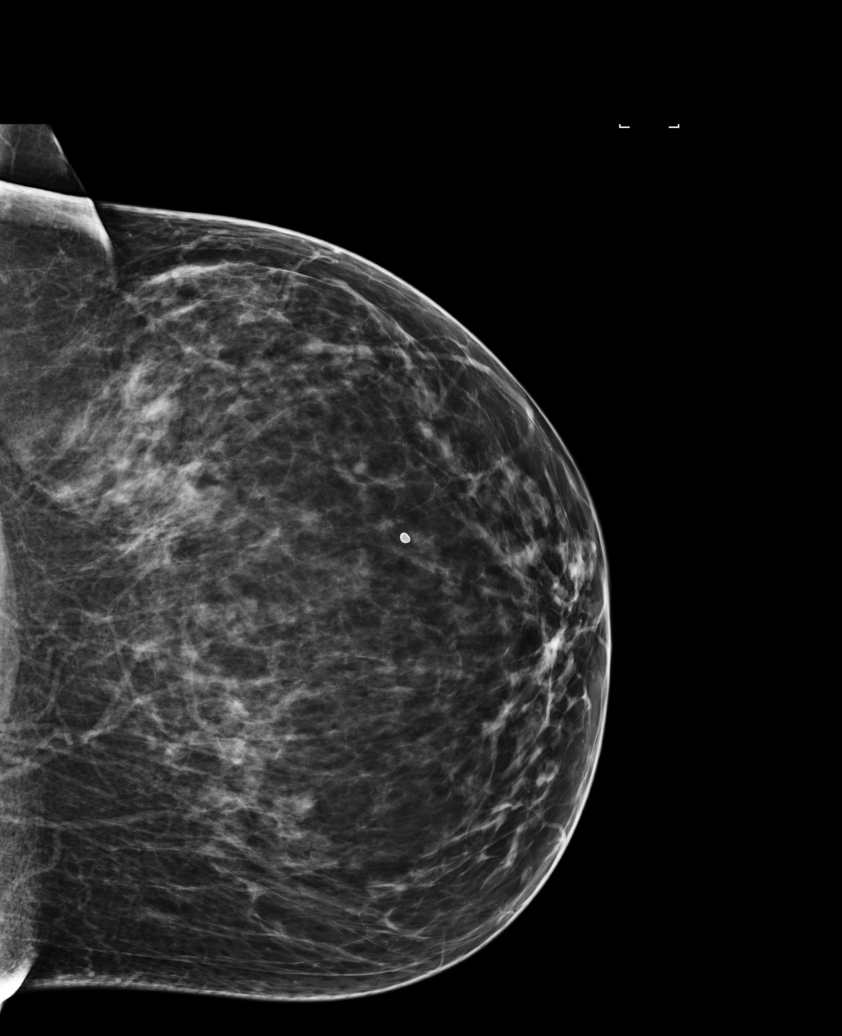

[L MLO synth-2D]
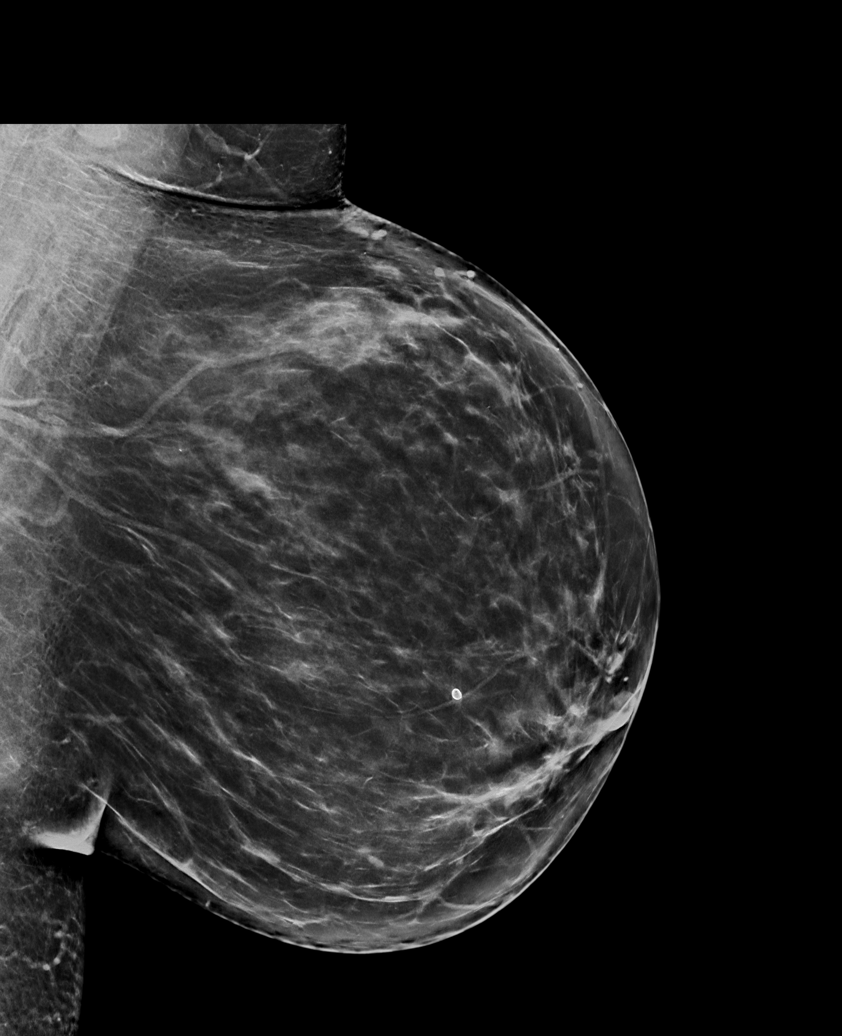

[L MLO]
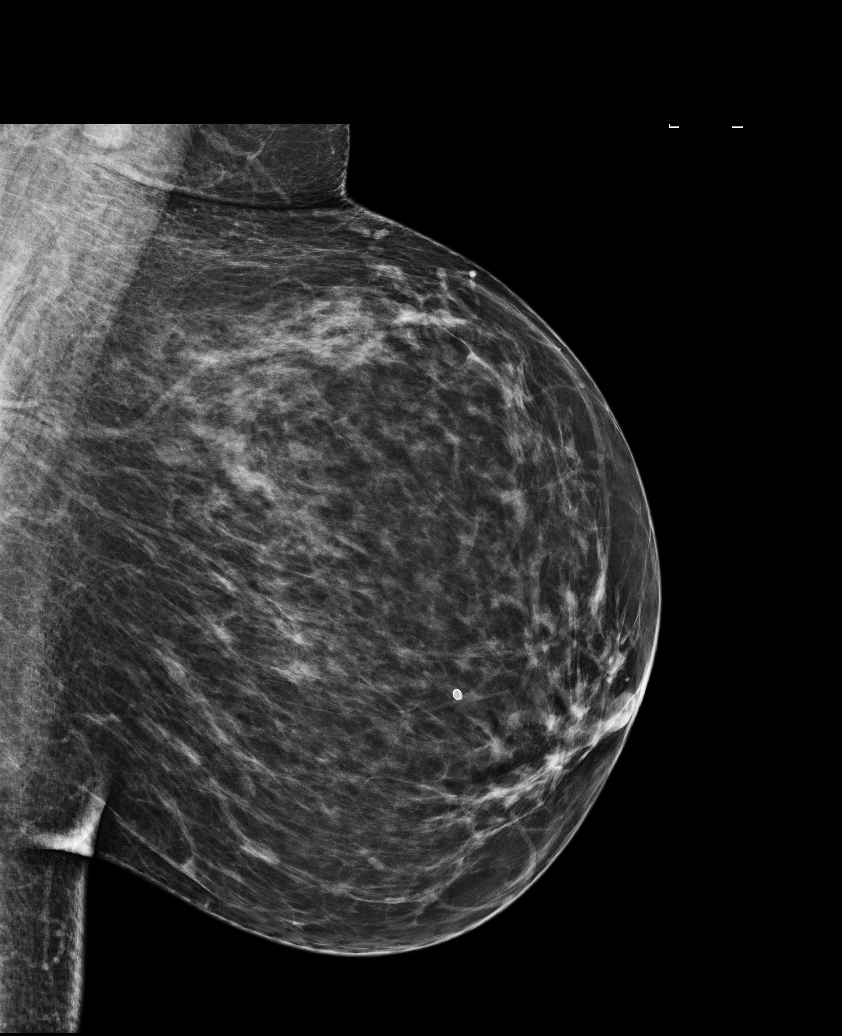

[L CC synth-2D]
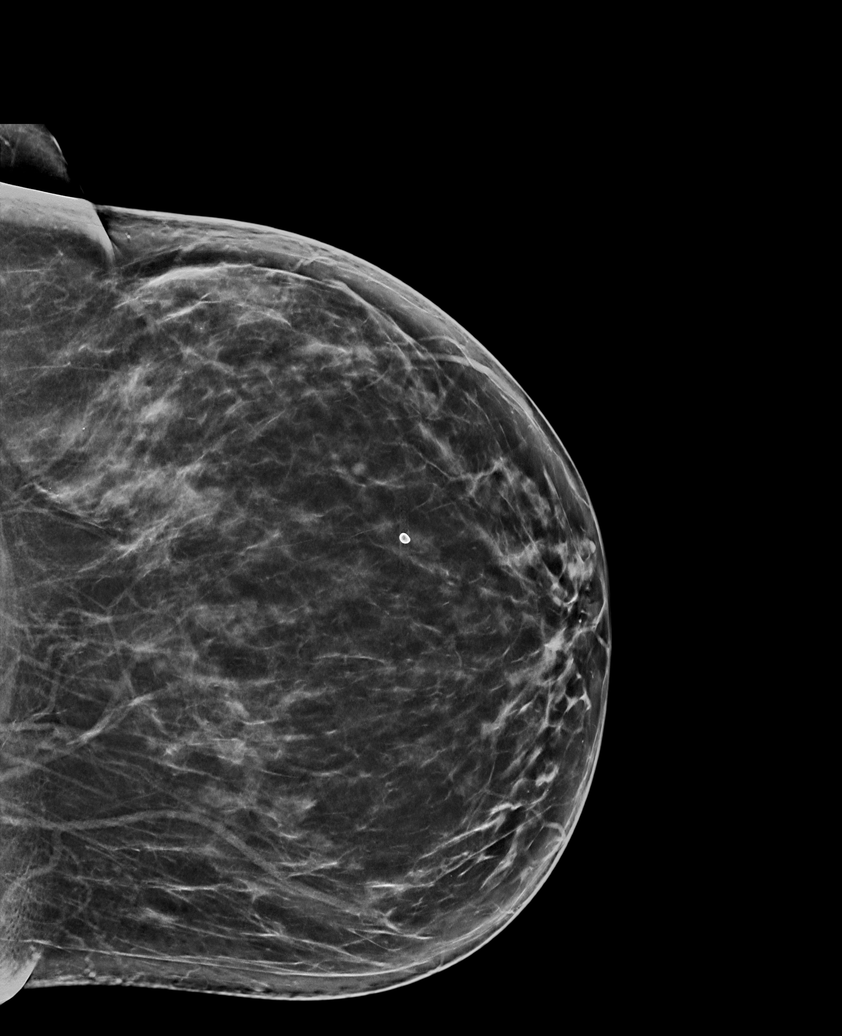

[L MLO tomo (1 of 2)]
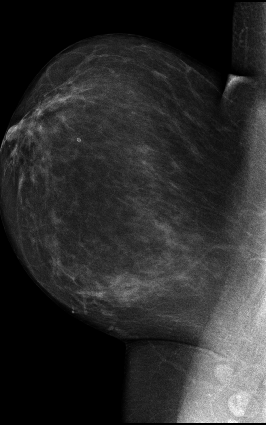

[L MLO tomo (2 of 2) · tomo slice 44/87.0]
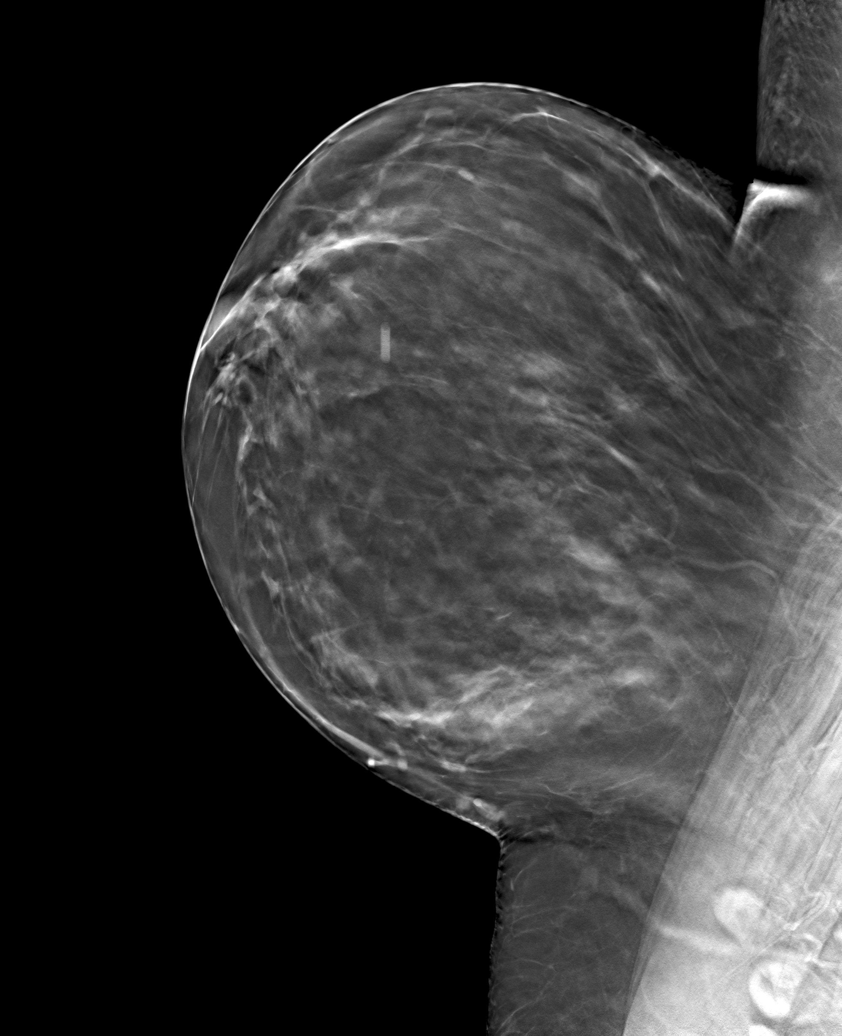

[L CC tomo · tomo slice 38/75.0]
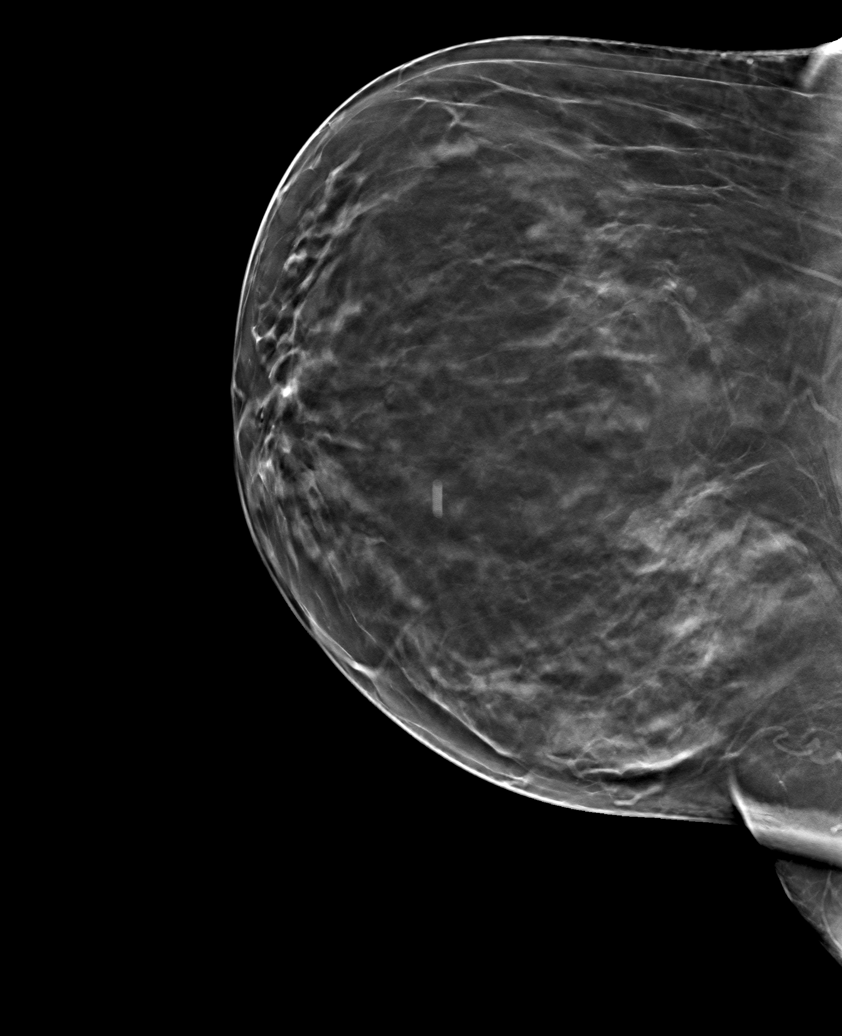

[7 of 15 positions shown; findings below may reference images not displayed]

ACR Breast Density Category b: There are scattered areas of
fibroglandular density.
FINDINGS: Routine 2D and 3D views the left breast demonstrate a stable
circumscribed oval mass within the posterior outer left breast.

No new mass, distortion or worrisome calcifications are identified
within the left breast.

Mammographic images were processed with CAD.

On physical exam, no palpable abnormalities identified within the
left breast.

Targeted ultrasound is performed, showing 2 very hypoechoic
circumscribed oval parallel masses within the left breast, the first
measuring 0.8 x 0.3 x 1.2 cm at the [DATE] position 11 cm from the
nipple and the second measuring 0.6 x 0.4 x 0.4 cm at the [DATE]
position 10 cm from the nipple. These likely represent benign cysts
but followup in 6 months is recommended to ensure 1 year stability.
IMPRESSION: Two very hypoechoic masses within the upper-outer left breast,
almost certainly representing benign cysts but followup in 6 months
is recommended to ensure 1 year stability.

RECOMMENDATION:
Bilateral diagnostic mammograms and left breast ultrasound in 6
months to resume annual mammogram schedule and to reassess likely
benign left breast finding.

I have discussed the findings and recommendations with the patient.
Results were also provided in writing at the conclusion of the
visit. If applicable, a reminder letter will be sent to the patient
regarding the next appointment.

BI-RADS CATEGORY  3: Probably benign.

## 2016-04-14 ENCOUNTER — Emergency Department: Payer: Managed Care, Other (non HMO)

## 2016-04-14 ENCOUNTER — Encounter: Payer: Self-pay | Admitting: Emergency Medicine

## 2016-04-14 ENCOUNTER — Emergency Department
Admission: EM | Admit: 2016-04-14 | Discharge: 2016-04-14 | Disposition: A | Discharge status: Home / Self Care | Payer: Managed Care, Other (non HMO) | Attending: Emergency Medicine | Admitting: Emergency Medicine

## 2016-04-14 DIAGNOSIS — M79602 Pain in left arm: Secondary | ICD-10-CM

## 2016-04-14 DIAGNOSIS — Z79899 Other long term (current) drug therapy: Secondary | ICD-10-CM | POA: Diagnosis not present

## 2016-04-14 DIAGNOSIS — I10 Essential (primary) hypertension: Secondary | ICD-10-CM | POA: Diagnosis not present

## 2016-04-14 DIAGNOSIS — E119 Type 2 diabetes mellitus without complications: Secondary | ICD-10-CM | POA: Insufficient documentation

## 2016-04-14 DIAGNOSIS — I82622 Acute embolism and thrombosis of deep veins of left upper extremity: Secondary | ICD-10-CM | POA: Diagnosis not present

## 2016-04-14 DIAGNOSIS — I82612 Acute embolism and thrombosis of superficial veins of left upper extremity: Secondary | ICD-10-CM | POA: Diagnosis not present

## 2016-04-14 DIAGNOSIS — M7989 Other specified soft tissue disorders: Secondary | ICD-10-CM | POA: Diagnosis present

## 2016-04-14 MED ORDER — RIVAROXABAN 15 MG PO TABS
15.0000 mg | ORAL_TABLET | Freq: Once | ORAL | Status: AC
  Administered 2016-04-14: 15 mg via ORAL
  Filled 2016-04-14: qty 1

## 2016-04-14 MED ORDER — RIVAROXABAN (XARELTO) VTE STARTER PACK (15 & 20 MG): ORAL_TABLET | ORAL | 0 refills | Status: DC

## 2016-04-14 NOTE — ED Notes (Signed)
Pt with family hx of blood clots. Left arm is red and warm to touch.

## 2016-04-14 NOTE — ED Provider Notes (Signed)
[facility] Emergency Department Provider Note  ____________________________________________  Time seen: Approximately 8:57 PM  I have reviewed the triage vital signs and the nursing notes.   HISTORY  Chief Complaint Arm Pain    HPI Christine Carroll is a 55 y.o. female who presents emergency department complaining of erythema and edema to the left medial elbow. Patient was seen in this department on days prior with abnormal uterine bleeding. Patient ended up being admitted and having a hysterectomy. Patient states that she had multiple IV and blood draws sticks to the right AC region. Patient reports that area is tender to palpation. Patient denies any history of blood clots in himself but states that she has a significant familial history of blood clots and is concerned for same. She denies any chest pain or shortness of breath, tachypnea, tachycardia. Patient is currently on antibiotics for upper respiratory infection. Patient denies any numbness or tingling in left upper extremity.  Patient denies any complaints at this time for abdominal pain, vaginal bleeding or discharge.   Past Medical History:  Diagnosis Date  . Depression   . Diabetes mellitus without complication ([place])   . Herpetic whitlow   . Hypertension   . Migraines   . Obesity     Patient Active Problem List   Diagnosis Date Noted  . Abnormal uterine bleeding unrelated to menstrual cycle 04/03/2016    Past Surgical History:  Procedure Laterality Date  . ABDOMINAL HYSTERECTOMY    . cesarean    . CHOLECYSTECTOMY    . COLONOSCOPY WITH PROPOFOL N/A 10/14/2015   Procedure: COLONOSCOPY WITH PROPOFOL;  Surgeon: Manya Silvas, MD;  Location: [facility] ENDOSCOPY;  Service: Endoscopy;  Laterality: N/A;  . LAPAROSCOPIC HYSTERECTOMY N/A 04/04/2016   Procedure: HYSTERECTOMY TOTAL LAPAROSCOPIC/ BILATERAL SALPINGECTOMY;  Surgeon: [name], MD;  Location: [facility] ORS;  Service: Gynecology;   Laterality: N/A;  . TUBAL LIGATION      Prior to Admission medications   Medication Sig Start Date End Date Taking? Authorizing Provider  aspirin EC 81 MG tablet Take 81 mg by mouth daily.    Historical Provider, MD  Black Cohosh 20 MG TABS Take 20 mg by mouth.    Historical Provider, MD  citalopram (CELEXA) 40 MG tablet Take 40 mg by mouth daily.    Historical Provider, MD  Cranberry 400 MG CAPS Take 400 capsules by mouth.    Historical Provider, MD  ergocalciferol (VITAMIN D2) 50000 units capsule Take 50,000 Units by mouth once a week.    Historical Provider, MD  gabapentin (NEURONTIN) 800 MG tablet Take 1 tablet (800 mg total) by mouth at bedtime. 04/04/16 04/07/16  [name], MD  hydrochlorothiazide (MICROZIDE) 12.5 MG capsule Take 12.5 mg by mouth daily.    Historical Provider, MD  nystatin [facility]) powder Apply topically 3 (three) times daily.    Historical Provider, MD  ondansetron (ZOFRAN) 4 MG tablet Take 1 tablet (4 mg total) by mouth every 6 (six) hours as needed for nausea. 04/04/16   [name], MD  oxycodone (OXY-IR) 5 MG capsule Take 1 capsule (5 mg total) by mouth every 6 (six) hours as needed for pain. 04/04/16   [name], MD  Rivaroxaban 15 & 20 MG TBPK Take as directed on package: Start with one 15mg  tablet by mouth twice a day with food. On Day 22, switch to one 20mg  tablet once a day with food. 04/14/16   Charline Bills Sharay Bellissimo, PA-C  vitamin B-12 (CYANOCOBALAMIN) 1000 MCG tablet  Take 1,000 mcg by mouth daily.    Historical Provider, MD    Allergies Vicodin [hydrocodone-acetaminophen]  Family History  Problem Relation Age of Onset  . Breast cancer Maternal Aunt     Social History Social History  Substance Use Topics  . Smoking status: Never Smoker  . Smokeless tobacco: Never Used  . Alcohol use No     Review of Systems  Constitutional: No fever/chills Cardiovascular: no chest pain. Respiratory: no cough. No SOB. Gastrointestinal: No abdominal  pain.  No nausea, no vomiting.  Musculoskeletal: Negative for musculoskeletal pain. Skin: Positive for erythema and edema to the left medial AC region Neurological: Negative for headaches, focal weakness or numbness. 10-point ROS otherwise negative.  ____________________________________________   PHYSICAL EXAM:  VITAL SIGNS: ED Triage Vitals  Enc Vitals Group     BP 04/14/16 1901 (!) 142/66     Pulse Rate 04/14/16 1901 99     Resp 04/14/16 1901 20     Temp 04/14/16 1901 97.8 F (36.6 C)     Temp Source 04/14/16 1901 Oral     SpO2 04/14/16 1901 100 %     Weight 04/14/16 1902 219 lb (99.3 kg)     Height 04/14/16 1902 5\' 4"  (1.626 m)     Head Circumference --      Peak Flow --      Pain Score 04/14/16 1902 4     Pain Loc --      Pain Edu? --      Excl. in [place]? --      Constitutional: Alert and oriented. Well appearing and in no acute distress. Eyes: Conjunctivae are normal. PERRL. EOMI. Head: Atraumatic. Neck: No stridor.   Hematological/Lymphatic/Immunilogical: No cervical lymphadenopathy. Cardiovascular: Normal rate, regular rhythm. Normal S1 and S2.  Good peripheral circulation. Respiratory: Normal respiratory effort without tachypnea or retractions. Lungs CTAB. Good air entry to the bases with no decreased or absent breath sounds. Gastrointestinal: Bowel sounds 4 quadrants. Soft and nontender to palpation. No guarding or rigidity. No palpable masses. No distention.  Musculoskeletal: Full range of motion to all extremities. No gross deformities appreciated. Neurologic:  Normal speech and language. No gross focal neurologic deficits are appreciated.  Skin:  Skin is warm, dry and intact. No rash noted. Erythema and edema noted to the left [facility] region. This occurs on the extreme medial aspect. Area is very tender to palpation. No induration or fluctuance. No palpable cords. Radial pulse intact and equal to the unaffected extremity. Sensation intact and equal to the unaffected  extremity. Psychiatric: Mood and affect are normal. Speech and behavior are normal. Patient exhibits appropriate insight and judgement.   ____________________________________________   LABS (all labs ordered are listed, but only abnormal results are displayed)  Labs Reviewed - No data to display ____________________________________________  EKG   ____________________________________________  RADIOLOGY Diamantina [place] Ashwin Tibbs, personally viewed and evaluated these images  as part of my medical decision making, as well as reviewing the written report by the radiologist.  US Venous Img Upper Uni Left  Result Date: 04/14/2016 CLINICAL DATA:  Acute onset of erythema and swelling near the antecubital fossa of the left arm. Recent IV catheter placement in this region. Initial encounter. EXAM: LEFT UPPER EXTREMITY VENOUS DOPPLER ULTRASOUND TECHNIQUE: Gray-scale sonography with graded compression, as well as color Doppler and duplex ultrasound were performed to evaluate the upper extremity deep venous system from the level of the subclavian vein and including the jugular, axillary, basilic, radial, ulnar and upper  cephalic vein. Spectral Doppler was utilized to evaluate flow at rest and with distal augmentation maneuvers. COMPARISON:  None. FINDINGS: Contralateral Subclavian Vein: Respiratory phasicity is normal and symmetric with the symptomatic side. No evidence of thrombus. Normal compressibility. Internal Jugular Vein: No evidence of thrombus. Normal compressibility, respiratory phasicity and response to augmentation. Subclavian Vein: No evidence of thrombus. Normal compressibility, respiratory phasicity and response to augmentation. Axillary Vein: No evidence of thrombus. Normal compressibility, respiratory phasicity and response to augmentation. Cephalic Vein: Occlusive thrombus is noted filling the left cephalic vein, from the mid upper arm to the antecubital fossa. Basilic Vein: Occlusive  thrombus is noted filling the left basilic vein along most of its length, extending into the forearm. Brachial Veins: No evidence of thrombus. Normal compressibility, respiratory phasicity and response to augmentation. Radial Veins: No evidence of thrombus. Normal compressibility, respiratory phasicity and response to augmentation. Ulnar Veins: No evidence of thrombus. Normal compressibility, respiratory phasicity and response to augmentation. Venous Reflux:  None visualized. Other Findings:  None visualized. IMPRESSION: Occlusive thrombus noted within the left cephalic and basilic veins. Thrombus extends from the mid upper arm to the antecubital fossa at the left cephalic vein, and involves most of the left basilic vein, extending into the forearm. These results were called by telephone at the time of interpretation on 04/14/2016 at 10:42 pm to [facility] PA, who verbally acknowledged these results. Electronically Signed   By: Garald Balding M.D.   On: 04/14/2016 22:44    ____________________________________________    PROCEDURES  Procedure(s) performed:    Procedures    Medications  Rivaroxaban (XARELTO) tablet 15 mg (not administered)     ____________________________________________   INITIAL IMPRESSION / ASSESSMENT AND PLAN / ED COURSE  Pertinent labs & imaging results that were available during my care of the patient were reviewed by me and considered in my medical decision making (see chart for details).  Review of the [place] CSRS was performed in accordance of the [place] prior to dispensing any controlled drugs.  Clinical Course     Patient's diagnosis is consistent with Cephalic and basilic vein thrombosis of the left arm. Patient presented to the emergency department complaining of redness and swelling to the left medial elbow. Patient had had recent surgery and was concerned for blood clot. Initially, it was felt that this would likely be either phlebitis or cellulitis,  however ultrasound returns with occlusive thrombus to the basilic and cephalic veins of the left arm. No labs are ordered at this time as patient has recent labs that are all reassuring. Due to the nature of 2 occlusive thrombosis in superficial veins, it is felt the patient would best be suited with a 3 month course of anticoagulation. After discussion of the patient with with the risk versus benefits of anticoagulation, patient will be placed on Xarelto for 3 months. She has close follow-up with her primary care and OB/GYN and will be followed up by these providers for this condition. Patient is given strict precautions to return to the emergency department for any sudden change or worsening of her symptoms. Patient's case is discussed with Dr Joni Fears prior to discharge..      ____________________________________________  FINAL CLINICAL IMPRESSION(S) / ED DIAGNOSES  Final diagnoses:  Cephalic vein thrombosis, left  Acute basilic vein thrombosis, left      NEW MEDICATIONS STARTED DURING THIS VISIT:  New Prescriptions   RIVAROXABAN 15 & 20 MG TBPK    Take as directed on package: Start with one 15mg   tablet by mouth twice a day with food. On Day 22, switch to one 20mg  tablet once a day with food.        This chart was dictated using voice recognition software/Dragon. Despite best efforts to proofread, errors can occur which can change the meaning. Any change was purely unintentional.    Darletta Moll, PA-C 04/14/16 [id number]    Carrie Mew, MD 04/24/16 [phone]

## 2016-04-14 NOTE — ED Triage Notes (Signed)
Pink area L inner elbow, oval area, states is sore not itchy.

## 2016-04-17 ENCOUNTER — Emergency Department: Payer: Managed Care, Other (non HMO)

## 2016-04-17 ENCOUNTER — Observation Stay: Payer: Managed Care, Other (non HMO)

## 2016-04-17 ENCOUNTER — Observation Stay
Admission: EM | Admit: 2016-04-17 | Discharge: 2016-04-18 | Disposition: A | Discharge status: Home / Self Care | Payer: Managed Care, Other (non HMO) | Attending: Obstetrics and Gynecology | Admitting: Obstetrics and Gynecology

## 2016-04-17 ENCOUNTER — Encounter: Payer: Self-pay | Admitting: Emergency Medicine

## 2016-04-17 DIAGNOSIS — M542 Cervicalgia: Secondary | ICD-10-CM | POA: Insufficient documentation

## 2016-04-17 DIAGNOSIS — I10 Essential (primary) hypertension: Secondary | ICD-10-CM | POA: Diagnosis not present

## 2016-04-17 DIAGNOSIS — Z9049 Acquired absence of other specified parts of digestive tract: Secondary | ICD-10-CM | POA: Insufficient documentation

## 2016-04-17 DIAGNOSIS — Y998 Other external cause status: Secondary | ICD-10-CM | POA: Diagnosis not present

## 2016-04-17 DIAGNOSIS — Y92481 Parking lot as the place of occurrence of the external cause: Secondary | ICD-10-CM | POA: Insufficient documentation

## 2016-04-17 DIAGNOSIS — E669 Obesity, unspecified: Secondary | ICD-10-CM | POA: Diagnosis not present

## 2016-04-17 DIAGNOSIS — R51 Headache: Secondary | ICD-10-CM | POA: Insufficient documentation

## 2016-04-17 DIAGNOSIS — R5381 Other malaise: Secondary | ICD-10-CM | POA: Diagnosis not present

## 2016-04-17 DIAGNOSIS — K449 Diaphragmatic hernia without obstruction or gangrene: Secondary | ICD-10-CM | POA: Insufficient documentation

## 2016-04-17 DIAGNOSIS — I471 Supraventricular tachycardia: Secondary | ICD-10-CM | POA: Insufficient documentation

## 2016-04-17 DIAGNOSIS — Z7982 Long term (current) use of aspirin: Secondary | ICD-10-CM | POA: Insufficient documentation

## 2016-04-17 DIAGNOSIS — Y9389 Activity, other specified: Secondary | ICD-10-CM | POA: Insufficient documentation

## 2016-04-17 DIAGNOSIS — N9489 Other specified conditions associated with female genital organs and menstrual cycle: Secondary | ICD-10-CM

## 2016-04-17 DIAGNOSIS — F329 Major depressive disorder, single episode, unspecified: Secondary | ICD-10-CM | POA: Insufficient documentation

## 2016-04-17 DIAGNOSIS — E119 Type 2 diabetes mellitus without complications: Secondary | ICD-10-CM | POA: Insufficient documentation

## 2016-04-17 DIAGNOSIS — R102 Pelvic and perineal pain: Secondary | ICD-10-CM | POA: Diagnosis not present

## 2016-04-17 DIAGNOSIS — D72829 Elevated white blood cell count, unspecified: Principal | ICD-10-CM | POA: Insufficient documentation

## 2016-04-17 DIAGNOSIS — Z86718 Personal history of other venous thrombosis and embolism: Secondary | ICD-10-CM | POA: Diagnosis not present

## 2016-04-17 DIAGNOSIS — Z90710 Acquired absence of both cervix and uterus: Secondary | ICD-10-CM | POA: Diagnosis not present

## 2016-04-17 DIAGNOSIS — N939 Abnormal uterine and vaginal bleeding, unspecified: Secondary | ICD-10-CM | POA: Insufficient documentation

## 2016-04-17 DIAGNOSIS — R05 Cough: Secondary | ICD-10-CM | POA: Insufficient documentation

## 2016-04-17 DIAGNOSIS — Z885 Allergy status to narcotic agent status: Secondary | ICD-10-CM | POA: Insufficient documentation

## 2016-04-17 DIAGNOSIS — Z6838 Body mass index (BMI) 38.0-38.9, adult: Secondary | ICD-10-CM | POA: Insufficient documentation

## 2016-04-17 LAB — URINALYSIS COMPLETE WITH MICROSCOPIC (ARMC ONLY)
Bilirubin Urine: NEGATIVE
GLUCOSE, UA: NEGATIVE mg/dL
Ketones, ur: NEGATIVE mg/dL
NITRITE: NEGATIVE
PH: 6 (ref 5.0–8.0)
Protein, ur: NEGATIVE mg/dL
SPECIFIC GRAVITY, URINE: 1.011 (ref 1.005–1.030)

## 2016-04-17 LAB — CBC
HCT: 36.3 % (ref 35.0–47.0)
Hemoglobin: 12.1 g/dL (ref 12.0–16.0)
MCH: 30.1 pg (ref 26.0–34.0)
MCHC: 33.3 g/dL (ref 32.0–36.0)
MCV: 90.3 fL (ref 80.0–100.0)
PLATELETS: 422 10*3/uL (ref 150–440)
RBC: 4.02 MIL/uL (ref 3.80–5.20)
RDW: 13.5 % (ref 11.5–14.5)
WBC: 21.5 10*3/uL — ABNORMAL HIGH (ref 3.6–11.0)

## 2016-04-17 LAB — INFLUENZA PANEL BY PCR (TYPE A & B)
Influenza A By PCR: NEGATIVE
Influenza B By PCR: NEGATIVE

## 2016-04-17 LAB — PROTIME-INR
INR: 2.58
Prothrombin Time: 28.2 seconds — ABNORMAL HIGH (ref 11.4–15.2)

## 2016-04-17 LAB — BASIC METABOLIC PANEL
Anion gap: 9 (ref 5–15)
BUN: 10 mg/dL (ref 6–20)
CALCIUM: 8.9 mg/dL (ref 8.9–10.3)
CO2: 24 mmol/L (ref 22–32)
CREATININE: 0.83 mg/dL (ref 0.44–1.00)
Chloride: 104 mmol/L (ref 101–111)
Glucose, Bld: 107 mg/dL — ABNORMAL HIGH (ref 65–99)
Potassium: 3.8 mmol/L (ref 3.5–5.1)
SODIUM: 137 mmol/L (ref 135–145)

## 2016-04-17 LAB — APTT: aPTT: 57 seconds — ABNORMAL HIGH (ref 24–36)

## 2016-04-17 LAB — TYPE AND SCREEN
ABO/RH(D): A POS
Antibody Screen: NEGATIVE

## 2016-04-17 MED ORDER — ACETAMINOPHEN 500 MG PO TABS
1000.0000 mg | ORAL_TABLET | Freq: Four times a day (QID) | ORAL | Status: DC | PRN
  Administered 2016-04-18: 1000 mg via ORAL
  Filled 2016-04-17: qty 2

## 2016-04-17 MED ORDER — IOPAMIDOL (ISOVUE-300) INJECTION 61%
100.0000 mL | Freq: Once | INTRAVENOUS | Status: AC | PRN
  Administered 2016-04-17: 100 mL via INTRAVENOUS

## 2016-04-17 MED ORDER — ONDANSETRON HCL 4 MG/2ML IJ SOLN
4.0000 mg | Freq: Four times a day (QID) | INTRAMUSCULAR | Status: DC | PRN
  Administered 2016-04-17 – 2016-04-18 (×3): 4 mg via INTRAVENOUS
  Filled 2016-04-17 (×2): qty 2

## 2016-04-17 MED ORDER — ONDANSETRON HCL 4 MG/2ML IJ SOLN
INTRAMUSCULAR | Status: AC
  Administered 2016-04-17: 4 mg via INTRAVENOUS
  Filled 2016-04-17: qty 2

## 2016-04-17 MED ORDER — SODIUM CHLORIDE 0.9 % IV BOLUS (SEPSIS)
1000.0000 mL | Freq: Once | INTRAVENOUS | Status: AC
  Administered 2016-04-17: 1000 mL via INTRAVENOUS

## 2016-04-17 MED ORDER — LACTATED RINGERS IV SOLN
INTRAVENOUS | Status: DC
  Administered 2016-04-17 – 2016-04-18 (×2): via INTRAVENOUS

## 2016-04-17 MED ORDER — ZOLPIDEM TARTRATE 5 MG PO TABS
5.0000 mg | ORAL_TABLET | Freq: Every evening | ORAL | Status: DC | PRN
  Administered 2016-04-17: 5 mg via ORAL
  Filled 2016-04-17: qty 1

## 2016-04-17 MED ORDER — ALUM & MAG HYDROXIDE-SIMETH 200-200-20 MG/5ML PO SUSP: 30.0000 mL | ORAL | Status: DC | PRN

## 2016-04-17 MED ORDER — IBUPROFEN 600 MG PO TABS
600.0000 mg | ORAL_TABLET | Freq: Four times a day (QID) | ORAL | Status: DC | PRN
  Administered 2016-04-17: 600 mg via ORAL
  Filled 2016-04-17: qty 1

## 2016-04-17 MED ORDER — ONDANSETRON HCL 4 MG/2ML IJ SOLN
4.0000 mg | Freq: Once | INTRAMUSCULAR | Status: AC
  Administered 2016-04-17: 4 mg via INTRAVENOUS

## 2016-04-17 MED ORDER — MAGNESIUM CITRATE PO SOLN
1.0000 | Freq: Once | ORAL | Status: DC | PRN
  Filled 2016-04-17: qty 296

## 2016-04-17 MED ORDER — MORPHINE SULFATE (PF) 4 MG/ML IV SOLN
4.0000 mg | Freq: Once | INTRAVENOUS | Status: DC
  Filled 2016-04-17: qty 1

## 2016-04-17 MED ORDER — SODIUM CHLORIDE 0.9 % IV SOLN
3.0000 g | Freq: Four times a day (QID) | INTRAVENOUS | Status: DC
  Administered 2016-04-17 – 2016-04-18 (×4): 3 g via INTRAVENOUS
  Filled 2016-04-17 (×7): qty 3

## 2016-04-17 MED ORDER — ONDANSETRON HCL 4 MG PO TABS: 4.0000 mg | ORAL_TABLET | Freq: Four times a day (QID) | ORAL | Status: DC | PRN

## 2016-04-17 MED ORDER — ONDANSETRON HCL 4 MG/2ML IJ SOLN
4.0000 mg | Freq: Once | INTRAMUSCULAR | Status: DC
  Filled 2016-04-17 (×2): qty 2

## 2016-04-17 MED ORDER — BISACODYL 5 MG PO TBEC: 5.0000 mg | DELAYED_RELEASE_TABLET | Freq: Every day | ORAL | Status: DC | PRN

## 2016-04-17 MED ORDER — RIVAROXABAN 15 MG PO TABS
15.0000 mg | ORAL_TABLET | Freq: Two times a day (BID) | ORAL | Status: DC
  Administered 2016-04-17 – 2016-04-18 (×2): 15 mg via ORAL
  Filled 2016-04-17 (×3): qty 1

## 2016-04-17 MED ORDER — MAGNESIUM HYDROXIDE 400 MG/5ML PO SUSP: 30.0000 mL | Freq: Every day | ORAL | Status: DC | PRN

## 2016-04-17 MED ORDER — DOCUSATE SODIUM 100 MG PO CAPS
100.0000 mg | ORAL_CAPSULE | Freq: Two times a day (BID) | ORAL | Status: DC
  Administered 2016-04-17 – 2016-04-18 (×3): 100 mg via ORAL
  Filled 2016-04-17 (×3): qty 1

## 2016-04-17 NOTE — ED Notes (Signed)
Assisted surgeon with pelvic exam.
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
## 2016-04-18 DIAGNOSIS — D72829 Elevated white blood cell count, unspecified: Secondary | ICD-10-CM | POA: Diagnosis not present

## 2016-04-18 LAB — CBC WITH DIFFERENTIAL/PLATELET
Basophils Absolute: 0.1 10*3/uL (ref 0–0.1)
Basophils Relative: 1 %
Eosinophils Absolute: 0.4 10*3/uL (ref 0–0.7)
Eosinophils Relative: 3 %
HEMATOCRIT: 34.5 % — AB (ref 35.0–47.0)
Hemoglobin: 11.8 g/dL — ABNORMAL LOW (ref 12.0–16.0)
LYMPHS PCT: 15 %
Lymphs Abs: 2.3 10*3/uL (ref 1.0–3.6)
MCH: 31.1 pg (ref 26.0–34.0)
MCHC: 34.2 g/dL (ref 32.0–36.0)
MCV: 91.1 fL (ref 80.0–100.0)
MONO ABS: 1.2 10*3/uL — AB (ref 0.2–0.9)
Monocytes Relative: 8 %
NEUTROS ABS: 11.7 10*3/uL — AB (ref 1.4–6.5)
Neutrophils Relative %: 73 %
Platelets: 377 10*3/uL (ref 150–440)
RBC: 3.78 MIL/uL — ABNORMAL LOW (ref 3.80–5.20)
RDW: 13 % (ref 11.5–14.5)
WBC: 15.7 10*3/uL — ABNORMAL HIGH (ref 3.6–11.0)

## 2016-04-18 LAB — COMPREHENSIVE METABOLIC PANEL
ALT: 48 U/L (ref 14–54)
ANION GAP: 6 (ref 5–15)
AST: 46 U/L — AB (ref 15–41)
Albumin: 3.2 g/dL — ABNORMAL LOW (ref 3.5–5.0)
Alkaline Phosphatase: 79 U/L (ref 38–126)
BILIRUBIN TOTAL: 0.8 mg/dL (ref 0.3–1.2)
BUN: 7 mg/dL (ref 6–20)
CALCIUM: 8.4 mg/dL — AB (ref 8.9–10.3)
CO2: 27 mmol/L (ref 22–32)
Chloride: 109 mmol/L (ref 101–111)
Creatinine, Ser: 0.71 mg/dL (ref 0.44–1.00)
GFR calc Af Amer: >60 mL/min (ref >60)
Glucose, Bld: 99 mg/dL (ref 65–99)
POTASSIUM: 3.8 mmol/L (ref 3.5–5.1)
Sodium: 142 mmol/L (ref 135–145)
TOTAL PROTEIN: 6.4 g/dL — AB (ref 6.5–8.1)

## 2016-04-18 LAB — URINE CULTURE: Culture: NO GROWTH

## 2016-04-18 MED ORDER — HYDROCOD POLST-CPM POLST ER 10-8 MG/5ML PO SUER
5.0000 mL | Freq: Two times a day (BID) | ORAL | Status: DC | PRN
  Administered 2016-04-18: 5 mL via ORAL
  Filled 2016-04-18: qty 5

## 2016-04-18 MED ORDER — HYDROCOD POLST-CPM POLST ER 10-8 MG/5ML PO SUER: 5.0000 mL | Freq: Two times a day (BID) | ORAL | 0 refills | Status: DC | PRN

## 2016-04-18 MED ORDER — ZOLPIDEM TARTRATE 5 MG PO TABS: 5.0000 mg | ORAL_TABLET | Freq: Every evening | ORAL | 0 refills | Status: DC | PRN

## 2016-04-18 MED ORDER — AMOXICILLIN-POT CLAVULANATE 875-125 MG PO TABS: 1.0000 | ORAL_TABLET | Freq: Two times a day (BID) | ORAL | 0 refills | Status: AC
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
## 2016-04-18 NOTE — Progress Notes (Signed)
Pt given dc instructions, f/u appt. Rx. Verbalizes understanding. Dc home

## 2016-04-18 NOTE — Discharge Summary (Signed)
Physician Discharge Summary  Patient ID: Christine Carroll MRN: [id number] DOB/AGE: 11/06/1960 55 y.o.  Admit date: 04/17/2016 Discharge date: 04/18/2016  Admission Diagnoses: Leukocytosis with tachycardia, malaise  Discharge Diagnoses:  Active Problems:   Leukocytosis   Discharged Condition: improved  Hospital Course: Pt admitted for IV abx overnight with clinical improvement after 24 hrs. WBC down from 21 to 15. No etiology of illness, though possibly incompletely treated bronchiectasis. She continues with a cough but improved. Chest xray was clear. The complex fluid collection noted on CT scan improved by TVUS to a tiny pocket, and it is likely that this was the vaginal bleeding she noted yesterday. No evidence of continued accumulation and no evidence of abscess.  Her vitals are normal: her tachycardia has resolved.  Her left arm with SVT is improved in decreased redness today.   Discharge home with tussionex if she tolerates for cough at night, Augmentin for 10 days, ambien for sleep temporarily if needed. Consults: hospitalist, but they did not end up seeing her  Significant Diagnostic Studies: labs: WBC from 21 to 15, microbiology: blood culture: pending- no growth in 24 hrs  and urine culture: pending- no growth in 24hrs and chest xray normal  Treatments: IV hydration and antibiotics: Unasyn  Discharge Exam: Blood pressure (!) 117/53, pulse 92, temperature 98.8 F (37.1 C), temperature source Oral, resp. rate 20, height 5\' 4"  (1.626 m), weight 222 lb (100.7 kg), SpO2 95 %. General appearance: alert, cooperative, appears stated age and no distress Head: Normocephalic, without obvious abnormality, atraumatic Resp: clear to auscultation bilaterally Chest wall: no tenderness Cardio: regular rate and rhythm, S1, S2 normal, no murmur, click, rub or gallop GI: soft, non-tender; bowel sounds normal; no masses,  no organomegaly Extremities: extremities normal, atraumatic, no  cyanosis or edema Pulses: 2+ and symmetric Incisions clean and dry, no erythema  Disposition: 01-Home or Self Care     Medication List    STOP taking these medications   cefUROXime 500 MG tablet Commonly known as:  CEFTIN     TAKE these medications   amoxicillin-clavulanate 875-125 MG tablet Commonly known as:  AUGMENTIN Take 1 tablet by mouth 2 (two) times daily.   benzonatate 100 MG capsule Commonly known as:  TESSALON Take 100 mg by mouth 3 (three) times daily as needed for cough.   Black Cohosh 20 MG Tabs Take 20 mg by mouth.   chlorpheniramine-HYDROcodone 10-8 MG/5ML Suer Commonly known as:  TUSSIONEX Take 5 mLs by mouth every 12 (twelve) hours as needed for cough.   citalopram 40 MG tablet Commonly known as:  CELEXA Take 40 mg by mouth daily.   Cranberry 400 MG Caps Take 400 capsules by mouth.   ergocalciferol 50000 units capsule Commonly known as:  VITAMIN D2 Take 50,000 Units by mouth once a week.   gabapentin 800 MG tablet Commonly known as:  NEURONTIN Take 1 tablet (800 mg total) by mouth at bedtime.   hydrochlorothiazide 12.5 MG capsule Commonly known as:  MICROZIDE Take 12.5 mg by mouth daily.   ondansetron 4 MG tablet Commonly known as:  ZOFRAN Take 1 tablet (4 mg total) by mouth every 6 (six) hours as needed for nausea.   oxycodone 5 MG capsule Commonly known as:  OXY-IR Take 1 capsule (5 mg total) by mouth every 6 (six) hours as needed for pain.   Rivaroxaban 15 & 20 MG Tbpk Take as directed on package: Start with one 15mg  tablet by mouth twice a day with food. On  Day 22, switch to one 20mg  tablet once a day with food.   vitamin B-12 1000 MCG tablet Commonly known as:  CYANOCOBALAMIN Take 1,000 mcg by mouth daily.   zolpidem 5 MG tablet Commonly known as:  AMBIEN Take 1 tablet (5 mg total) by mouth at bedtime as needed for sleep.        SignedBenjaman Kindler 04/18/2016, 10:39 AM

## 2016-04-22 LAB — CULTURE, BLOOD (ROUTINE X 2): Culture: NO GROWTH

## 2016-04-29 DIAGNOSIS — I82612 Acute embolism and thrombosis of superficial veins of left upper extremity: Secondary | ICD-10-CM | POA: Insufficient documentation
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
## 2016-04-30 ENCOUNTER — Encounter: Payer: Self-pay | Admitting: Hematology and Oncology

## 2016-04-30 ENCOUNTER — Inpatient Hospital Stay: Payer: Managed Care, Other (non HMO)

## 2016-04-30 ENCOUNTER — Inpatient Hospital Stay: Payer: Managed Care, Other (non HMO) | Attending: Hematology and Oncology | Admitting: Hematology and Oncology

## 2016-04-30 VITALS — BP 135/82 | HR 76 | Temp 98.5°F | Resp 18 | Ht 64.0 in | Wt 212.5 lb

## 2016-04-30 DIAGNOSIS — Z90710 Acquired absence of both cervix and uterus: Secondary | ICD-10-CM

## 2016-04-30 DIAGNOSIS — I82612 Acute embolism and thrombosis of superficial veins of left upper extremity: Secondary | ICD-10-CM | POA: Diagnosis not present

## 2016-04-30 DIAGNOSIS — Z79899 Other long term (current) drug therapy: Secondary | ICD-10-CM

## 2016-04-30 DIAGNOSIS — I10 Essential (primary) hypertension: Secondary | ICD-10-CM | POA: Insufficient documentation

## 2016-04-30 DIAGNOSIS — R634 Abnormal weight loss: Secondary | ICD-10-CM | POA: Insufficient documentation

## 2016-04-30 DIAGNOSIS — Z90722 Acquired absence of ovaries, bilateral: Secondary | ICD-10-CM

## 2016-04-30 DIAGNOSIS — Z9889 Other specified postprocedural states: Secondary | ICD-10-CM | POA: Insufficient documentation

## 2016-04-30 DIAGNOSIS — N6320 Unspecified lump in the left breast, unspecified quadrant: Secondary | ICD-10-CM | POA: Insufficient documentation

## 2016-04-30 DIAGNOSIS — N939 Abnormal uterine and vaginal bleeding, unspecified: Secondary | ICD-10-CM | POA: Diagnosis not present

## 2016-04-30 DIAGNOSIS — Z7901 Long term (current) use of anticoagulants: Secondary | ICD-10-CM | POA: Insufficient documentation

## 2016-05-01 LAB — BETA-2-GLYCOPROTEIN I ABS, IGG/M/A
Beta-2 Glyco I IgG: <9 GPI IgG units (ref 0–20)
Beta-2-Glycoprotein I IgA: 28 GPI IgA units — ABNORMAL HIGH (ref 0–25)
Beta-2-Glycoprotein I IgM: 15 GPI IgM units (ref 0–32)

## 2016-05-01 LAB — ANTITHROMBIN PANEL
AT III AG PPP IMM-ACNC: 74 % (ref 72–124)
Antithrombin Activity: 198 % — ABNORMAL HIGH (ref 75–135)

## 2016-05-01 LAB — CARDIOLIPIN ANTIBODIES, IGG, IGM, IGA
Anticardiolipin IgA: <9 APL U/mL (ref 0–11)
Anticardiolipin IgG: <9 GPL U/mL (ref 0–14)
Anticardiolipin IgM: <9 MPL U/mL (ref 0–12)

## 2016-05-02 LAB — LUPUS ANTICOAGULANT PANEL
DRVVT: 96.2 s — ABNORMAL HIGH (ref 0.0–47.0)
PTT Lupus Anticoagulant: 44.3 s (ref 0.0–51.9)

## 2016-05-02 LAB — DRVVT CONFIRM: dRVVT Confirm: 1.3 ratio — ABNORMAL HIGH (ref 0.8–1.2)

## 2016-05-02 LAB — DRVVT MIX: dRVVT Mix: 87.5 s — ABNORMAL HIGH (ref 0.0–47.0)

## 2016-05-03 LAB — FACTOR 5 LEIDEN

## 2016-05-04 LAB — PROTHROMBIN GENE MUTATION

## 2016-05-08 ENCOUNTER — Other Ambulatory Visit: Payer: Self-pay | Admitting: Internal Medicine

## 2016-05-08 DIAGNOSIS — R911 Solitary pulmonary nodule: Secondary | ICD-10-CM

## 2016-05-10 NOTE — Progress Notes (Signed)
[facility] day:  05/11/2016  Chief Complaint: Christine Carroll is a 55 y.o. 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  HPI:  The patient was last seen in the hematology clinic on 04/30/2016.  At that time, she was seen for initial consultation.  She had developed a left upper extremity superficial thrombosis after peripheral IV access and TAH-BSO on 04/04/2016.  She was on Xarelto. She had a family history of thrombosis.  It was recommended that she be on anticoagulation for 3 months to relieve symptoms associated with the thrombosis, improve patency of the veins, and prevent recurrence/clot extension.    CBC on 04/18/2016 revealed a hematocrit of 34.5, hemoglobin 11.8, MCV 91.1, platelets 377,000, white count 13,700 with an ANC of 11,700.  Comprehensive metabolic panel included a calcium of 8.4, albumin 3.2, protein 6.4, and AST 46.  She underwent hypercoagulable work-up.  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  Beta-2 glycoprotein IgA was 28 (elevated).  The following studies were negative: factor V Leiden, prothrombin gene mutation, anticardiolipin antibodies, ATIII activity (198%) and antigen (74%).  Symptomatically, she denies any new complaints.  She denies any bruising or bleeding.   Past Medical History:  Diagnosis Date  . Depression   . Herpetic whitlow   . Hypertension   . Migraines   . Obesity     Past Surgical History:  Procedure Laterality Date  . ABDOMINAL HYSTERECTOMY    . cesarean    . CHOLECYSTECTOMY    . COLONOSCOPY WITH PROPOFOL N/A 10/14/2015   Procedure: COLONOSCOPY WITH PROPOFOL;  Surgeon: Manya Silvas, MD;  Location[facility] ENDOSCOPY;  Service: Endoscopy;  Laterality: N/A;  . LAPAROSCOPIC HYSTERECTOMY N/A 04/04/2016   Procedure: HYSTERECTOMY TOTAL LAPAROSCOPIC/ BILATERAL SALPINGECTOMY;  Surgeon: [name], MD;  Location:  [facility] ORS;  Service: Gynecology;  Laterality: N/A;  . TUBAL LIGATION      Family History  Problem Relation Age of Onset  . Breast cancer Maternal Aunt   . Hypertension Mother   . Stroke Mother   . CAD Father   . Deep vein thrombosis Father   . Pulmonary embolism Sister   . Deep vein thrombosis Other     Social History:  reports that she has never smoked. She has never used smokeless tobacco. She reports that she does not drink alcohol or use drugs.  The patient lives in [place].  She is a Merchandiser, retail. The patient is alone today.  Allergies:  Allergies  Allergen Reactions  . Vicodin [Hydrocodone-Acetaminophen] Nausea And Vomiting    Current Medications: Current Outpatient Prescriptions  Medication Sig Dispense Refill  . acetaminophen (TYLENOL) 500 MG tablet Take 1,000 mg by mouth every 8 (eight) hours as needed.    . Biotin 10000 MCG TABS Take 1 tablet by mouth daily.    . Black Cohosh 20 MG TABS Take 20 mg by mouth.    . Calcium Carb-Cholecalciferol (CALCIUM 600 + D) 600-200 MG-UNIT TABS Take 1 tablet by mouth daily.    . citalopram (CELEXA) 40 MG tablet Take 40 mg by mouth daily.    . Cranberry 400 MG CAPS Take 400 capsules by mouth.    . ergocalciferol (VITAMIN D2) 50000 units capsule Take 50,000 Units by mouth once a week.    . Garlic XX123456 MG TABS Take 1 tablet by mouth daily.    . hydrochlorothiazide (MICROZIDE) 12.5 MG capsule Take 12.5  mg by mouth daily.    . vitamin B-12 (CYANOCOBALAMIN) 1000 MCG tablet Take 1,000 mcg by mouth daily.    Marland Kitchen gabapentin (NEURONTIN) 800 MG tablet Take 1 tablet (800 mg total) by mouth at bedtime. 3 tablet 0  . oxycodone (OXY-IR) 5 MG capsule Take 1 capsule (5 mg total) by mouth every 6 (six) hours as needed for pain. (Patient not taking: Reported on 05/11/2016) 10 capsule 0  . Rivaroxaban 15 & 20 MG TBPK Take as directed on package: Start with one 15mg  tablet by mouth twice a day with food. On Day 22, switch to one 20mg  tablet once a day  with food. (Patient not taking: Reported on 05/11/2016) 51 each 0   No current facility-administered medications for this visit.     Review of Systems:  GENERAL:  Energy level good.  No fevers or sweats.  Weight up 4 pounds since last visit. PERFORMANCE STATUS (ECOG):  1 HEENT:  No visual changes, runny nose, sore throat, mouth sores or tenderness. Lungs: No shortness of breath or cough.  No hemoptysis. Cardiac:  No chest pain, palpitations, orthopnea, or PND. GI:  No nausea, vomiting, diarrhea, constipation, melena or hematochezia. GU:  No urgency, frequency, dysuria, or hematuria.  Intermittent vaginal spotting. Musculoskeletal:  No back pain.  Right knee problems.  No muscle tenderness. Extremities:  No pain or swelling. Skin:  Granuloma annulare.  No rashes or skin changes. Neuro:  No headache, numbness or weakness, balance or coordination issues. Endocrine:  No diabetes, thyroid issues, hot flashes or night sweats.  Post-menopausal. Psych:  No mood changes, depression or anxiety. Pain:  No focal pain. Review of systems:  All other systems reviewed and found to be negative.  Physical Exam: Blood pressure 124/79, pulse 71, temperature 97.4 F (36.3 C), temperature source Tympanic, resp. rate 18, weight 216 lb 0.8 oz (98 kg). GENERAL:  Well developed, well nourished, woman sitting comfortably in the exam room in no acute distress. MENTAL STATUS:  Alert and oriented to person, place and time. HEAD:  Long light brown hair with slight graying.  Normocephalic, atraumatic, face symmetric, no Cushingoid features. EYES:  No conjunctivitis or scleral icterus. NEUROLOGICAL: Unremarkable. PSYCH:  Appropriate.   No visits with results within 3 Day(s) from this visit.  Latest known visit with results is:  Clinical Support on 04/30/2016  Component Date Value Ref Range Status  . Recommendations-F5LEID: 05/03/2016 Comment   Final   Comment: (NOTE) Result:  Negative (no mutation  found) Factor V Leiden is a specific mutation (R506Q) in the factor V gene that is associated with an increased risk of venous thrombosis. Factor V Leiden is more resistant to inactivation by activated protein C.  As a result, factor V persists in the circulation leading to a mild hyper- coagulable state.  The Leiden mutation accounts for 90% - 95% of APC resistance.  Factor V Leiden has been reported in patients with deep vein thrombosis, pulmonary embolus, central retinal vein occlusion, cerebral sinus thrombosis and hepatic vein thrombosis. Other risk factors to be considered in the workup for venous thrombosis include the G20210A mutation in the factor II (prothrombin) gene, protein S and C deficiency, and antithrombin deficiencies. Anticardiolipin antibody and lupus anticoagulant analysis may be appropriate for certain patients, as well as homocysteine levels. Contact your local [facility] for information on how to order additi  onal testing if desired.   . Comment 05/03/2016 Comment   Corrected   Comment: (NOTE) **Genetic counselors are available for health care providers to**  discuss results at [phone] [phone]). Methodology: DNA analysis of the Factor V gene was performed by allele-specific PCR. The diagnostic sensitivity and specificity is >99% for both. Molecular-based testing is highly accurate, but as in any laboratory test, diagnostic errors may occur. All test results must be combined with clinical information for the most accurate interpretation. This test was developed and its performance characteristics determined by [facility]. It has not been cleared or approved by the Food and Drug Administration. References: Voelkerding K (1996).  Clin Lab Med [phone]. Allison Quarry, PhD, [facility] Ruben Reason, PhD, [facility] Jens Som, PhD, [facility] Annetta Maw, M.S., PhD, [facility] Alfredo Bach, PhD, [facility] Norva Riffle, PhD,  [facility] Earlean Polka PhD, [facility] Performed At: [place], [place] [id number] Rema Fendt                          D [id number]   . Recommendations-PTGENE: 05/04/2016 Comment   Final   Comment: (NOTE) NEGATIVE No mutation identified. Comment: A point mutation (G20210A) in the factor II (prothrombin) gene is the second most common cause of inherited thrombophilia. The incidence of this mutation in the [place] Caucasian population is about 2% and in the [place] American population it is approximately 0.5%. This mutation is rare in the [place] and Native American population. Being heterozygous for a prothrombin mutation increases the risk for developing venous thrombosis about 2 to 3 times above the general population risk. Being homozygous for the prothrombin gene mutation increases the relative risk for venous thrombosis further, although it is not yet known how much further the risk is increased. In women heterozygous for the prothrombin gene mutation, the use of estrogen containing oral contraceptives increases the relative risk of venous thrombosis about 16 times and the risk of developing cerebral thrombosis is also significantly increased. In pregnancy the pr                          othrombin gene mutation increases risk for venous thrombosis and may increase risk for stillbirth, placental abruption, pre-eclampsia and fetal growth restriction. If the patient possesses two or more congenital or acquired thrombophilic risk factors, the risk for thrombosis may rise to more than the sum of the risk ratios for the individual mutations. This assay detects only the prothrombin G20210A mutation and does not measure genetic abnormalities elsewhere in the genome. Other thrombotic risk factors may be pursued through systematic clinical laboratory analysis. These factors include the R506Q (Leiden) mutation in the Factor V gene, plasma  homocysteine levels, as well as testing for deficiencies of antithrombin III, protein C and protein S.   . Additional Information 05/04/2016 Comment   Final   Comment: (NOTE) Genetic Counselors are available for health care providers to discuss results at [phone] [phone]). Methodology: DNA analysis of the Factor II gene was performed by PCR amplification followed by restriction analysis. The diagnostic sensitivity is >99% for both. All the tests must be combined with clinical information for the most accurate interpretation. Molecular-based testing is highly accurate, but as in any laboratory test, diagnostic errors may occur. This test was developed and its performance characteristics determined by [facility]. It has not been cleared or approved by the Food and Drug Administration. Poort SR, et  al. Blood. [id number]. Varga EA. Circulation. 2004; [id number]. Mervin Hack, et Thayer; 19:700-703. Allison Quarry, PhD, [facility] Ruben Reason, PhD, [facility] Jens Som, PhD, [facility] Annetta Maw, M.S., PhD, [facility] Alfredo Bach, PhD, [facility] Norva Riffle, PhD, [facility] Earlean Polka,                           PhD, [facility] Performed At: [facility] [address] [place], [place] [id number] Nechama Guard MD [id number]   . PTT Lupus Anticoagulant 05/02/2016 44.3  0.0 - 51.9 sec Final  . DRVVT 05/02/2016 96.2* 0.0 - 47.0 sec Final  . Lupus Anticoag Interp 05/02/2016 Comment:   Corrected   Comment: (NOTE) Results are consistent with the presence of a lupus anticoagulant. NOTE: Only persistent lupus anticoagulants are thought to be of clinical significance. For this reason, repeat testing in 12 or more weeks after an initial positive result should be considered to confirm or refute the presence of a lupus anticoagulant, depending on clinical presentation. Results of lupus anticoagulant tests may be falsely positive in  the presence of certain anticoagulant therapies. Performed At: [facility] [place], [place] [id number] Lindon Romp MD [id number]   . Anticardiolipin IgG 05/01/2016 <9  0 - 14 GPL U/mL Final   Comment: (NOTE)                          Negative:              <15                          Indeterminate:     15 - 20                          Low-Med Positive: >20 - 80                          High Positive:         >80   . Anticardiolipin IgM 05/01/2016 <9  0 - 12 MPL U/mL Final   Comment: (NOTE)                          Negative:              <13                          Indeterminate:     13 - 20                          Low-Med Positive: >20 - 80                          High Positive:         >80   . Anticardiolipin IgA 05/01/2016 <9  0 - 11 APL U/mL Final   Comment: (NOTE)                          Negative:              <12  Indeterminate:     12 - 20                          Low-Med Positive: >20 - 80                          High Positive:         >80 Performed At: [facility] [place], [place] [id number] Lindon Romp MD [id number]   . Beta-2 Glyco I IgG 05/01/2016 <9  0 - 20 GPI IgG units Final   Comment: (NOTE) The reference interval reflects a 3SD or 99th percentile interval, which is thought to represent a potentially clinically significant result in accordance with the International Consensus Statement on the classification criteria for definitive antiphospholipid syndrome (APS). J Thromb Haem 2006;4:295-306.   . Beta-2-Glycoprotein I IgM 05/01/2016 15  0 - 32 GPI IgM units Final   Comment: (NOTE) The reference interval reflects a 3SD or 99th percentile interval, which is thought to represent a potentially clinically significant result in accordance with the International Consensus Statement on the classification criteria for definitive antiphospholipid syndrome (APS). J Thromb Haem  2006;4:295-306. Performed At: [facility] [place], [place] [id number] Lindon Romp MD [id number]   . Beta-2-Glycoprotein I IgA 05/01/2016 28* 0 - 25 GPI IgA units Final   Comment: (NOTE) The reference interval reflects a 3SD or 99th percentile interval, which is thought to represent a potentially clinically significant result in accordance with the International Consensus Statement on the classification criteria for definitive antiphospholipid syndrome (APS). J Thromb Haem 2006;4:295-306.   Marland Kitchen Antithrombin Activity 05/01/2016 198* 75 - 135 % Final   Comment: (NOTE) An elevated antithrombin activity is of no known clinical significance. Direct thrombin inhibitor anticoagulants such as rivaroxaban, apixaban and edoxaban will lead to spuriously elevated antithrombin activity levels possibly masking a deficiency.   . AT III AG PPP IMM-ACNC 05/01/2016 74  72 - 124 % Final   Comment: (NOTE) This test was developed and its performance characteristics determined by [facility]. It has not been cleared or approved by the Food and Drug Administration. Performed At: [facility] [place], [place] [id number] Lindon Romp MD [id number]   . dRVVT Mix 05/02/2016 87.5* 0.0 - 47.0 sec Final   Comment: (NOTE) Performed At: [facility] [place], [place] [id number] Lindon Romp MD [id number]   . dRVVT Confirm 05/02/2016 1.3* 0.8 - 1.2 ratio Final   Comment: (NOTE) Performed At: [facility] [address] [place], [place] [id number] Lindon Romp MD [id number]     Assessment:  EVALISE DELLY is a 55 y.o. female with a history of left upper extremity superficial thrombosis following peripheral IV access and TAH-BSO on 04/04/2016.  She is on Xarelto.  Left upper extremity duplex on 04/14/2016 revealed occlusive thrombus within the left cephalic and basilic veins. Thrombus extended from the mid  upper arm to the antecubital fossa at the left cephalic vein and involved most of the left basilic vein extending into the forearm.  Hypercoagulable work-up on 04/30/2016 revealed a positive lupus anticoagulant and beta-2 glycoprotein IgA of 28 (elevated).  The following studies were negative: CBC with diff, factor V Leiden, prothrombin gene mutation, anticardiolipin antibodies, ATIII activity (198%) and antigen (74%).  Bilateral diagnostic mammogram on 03/20/2016 revealed stable, probably benign masses within the upper outer quadrant of the left  breast most likely benign cysts   Family history is notable for thrombosis in her father (DVT in his 48s), sister (PE in her 35s), and daughter (DVTs in her 80s).  Symptomatically, she denies any complaints.  She denies any bruising or bleeding.  Exam is unremarkable.  Plan: 1.  Discuss hypercoagulable work-up.  Discuss possible lupus anticoagulant.  Discuss retesting after 12 weeks.  Ensure no interference with Xarelto.  Patient will hold Xarelto x 2 days, test, then restart Xarelto until results back.  If testing positive, will remain on Xarelto for a second testing 3 months later. 2.  Continue Xarelto until 2 days prior to planned testing around 07/31/2015 then restart after testing. 3.  Anticipate follow-up left sided mammogram on 09/18/2016. 4.  RTC on 07/30/2016 for labs (lupus anticoagulant panel, anricardiolipin antibodies, protein C antigen/activity, protein S antigen/activity) 5.  RTC on 08/06/2016 for MD review of testing.   Lequita Asal, MD  05/11/2016, 9:03 AM

## 2016-05-11 ENCOUNTER — Inpatient Hospital Stay (HOSPITAL_BASED_OUTPATIENT_CLINIC_OR_DEPARTMENT_OTHER): Payer: Managed Care, Other (non HMO) | Admitting: Hematology and Oncology

## 2016-05-11 VITALS — BP 124/79 | HR 71 | Temp 97.4°F | Resp 18 | Wt 216.1 lb

## 2016-05-11 DIAGNOSIS — Z9889 Other specified postprocedural states: Secondary | ICD-10-CM | POA: Diagnosis not present

## 2016-05-11 DIAGNOSIS — R760 Raised antibody titer: Secondary | ICD-10-CM

## 2016-05-11 DIAGNOSIS — N6320 Unspecified lump in the left breast, unspecified quadrant: Secondary | ICD-10-CM | POA: Diagnosis not present

## 2016-05-11 DIAGNOSIS — I82612 Acute embolism and thrombosis of superficial veins of left upper extremity: Secondary | ICD-10-CM | POA: Diagnosis not present

## 2016-05-11 DIAGNOSIS — I10 Essential (primary) hypertension: Secondary | ICD-10-CM | POA: Diagnosis not present

## 2016-05-11 DIAGNOSIS — Z7901 Long term (current) use of anticoagulants: Secondary | ICD-10-CM | POA: Diagnosis not present

## 2016-05-11 DIAGNOSIS — Z90722 Acquired absence of ovaries, bilateral: Secondary | ICD-10-CM | POA: Diagnosis not present

## 2016-05-11 DIAGNOSIS — Z90710 Acquired absence of both cervix and uterus: Secondary | ICD-10-CM | POA: Diagnosis not present

## 2016-05-11 DIAGNOSIS — Z79899 Other long term (current) drug therapy: Secondary | ICD-10-CM | POA: Diagnosis not present

## 2016-05-11 DIAGNOSIS — N939 Abnormal uterine and vaginal bleeding, unspecified: Secondary | ICD-10-CM | POA: Diagnosis not present

## 2016-05-11 DIAGNOSIS — R634 Abnormal weight loss: Secondary | ICD-10-CM

## 2016-07-27 ENCOUNTER — Encounter: Payer: Self-pay | Admitting: Hematology and Oncology

## 2016-07-30 ENCOUNTER — Inpatient Hospital Stay: Payer: Managed Care, Other (non HMO) | Attending: Hematology and Oncology

## 2016-07-30 DIAGNOSIS — Z86718 Personal history of other venous thrombosis and embolism: Secondary | ICD-10-CM | POA: Diagnosis not present

## 2016-07-30 DIAGNOSIS — E669 Obesity, unspecified: Secondary | ICD-10-CM | POA: Insufficient documentation

## 2016-07-30 DIAGNOSIS — Z885 Allergy status to narcotic agent status: Secondary | ICD-10-CM | POA: Insufficient documentation

## 2016-07-30 DIAGNOSIS — I10 Essential (primary) hypertension: Secondary | ICD-10-CM | POA: Insufficient documentation

## 2016-07-30 DIAGNOSIS — R634 Abnormal weight loss: Secondary | ICD-10-CM | POA: Diagnosis not present

## 2016-07-30 DIAGNOSIS — D6862 Lupus anticoagulant syndrome: Secondary | ICD-10-CM | POA: Insufficient documentation

## 2016-07-30 DIAGNOSIS — I82612 Acute embolism and thrombosis of superficial veins of left upper extremity: Secondary | ICD-10-CM

## 2016-07-30 DIAGNOSIS — Z7901 Long term (current) use of anticoagulants: Secondary | ICD-10-CM | POA: Insufficient documentation

## 2016-07-30 DIAGNOSIS — Z79899 Other long term (current) drug therapy: Secondary | ICD-10-CM | POA: Insufficient documentation

## 2016-07-30 DIAGNOSIS — R760 Raised antibody titer: Secondary | ICD-10-CM

## 2016-07-31 ENCOUNTER — Ambulatory Visit
Admission: RE | Admit: 2016-07-31 | Discharge: 2016-07-31 | Disposition: A | Discharge status: Home / Self Care | Payer: Managed Care, Other (non HMO) | Source: Ambulatory Visit | Attending: Internal Medicine | Admitting: Internal Medicine

## 2016-07-31 DIAGNOSIS — R911 Solitary pulmonary nodule: Secondary | ICD-10-CM | POA: Diagnosis present

## 2016-07-31 LAB — PROTEIN S, TOTAL AND FREE
Protein S Ag, Free: 107 % (ref 57–157)
Protein S Ag, Total: 82 % (ref 60–150)

## 2016-07-31 LAB — LUPUS ANTICOAGULANT PANEL
DRVVT: 28 s (ref 0.0–47.0)
PTT Lupus Anticoagulant: 28.3 s (ref 0.0–51.9)

## 2016-07-31 LAB — PROTEIN C ACTIVITY: Protein C Activity: 127 % (ref 73–180)

## 2016-08-01 LAB — CARDIOLIPIN ANTIBODIES, IGG, IGM, IGA
Anticardiolipin IgA: <9 APL U/mL (ref 0–11)
Anticardiolipin IgG: <9 GPL U/mL (ref 0–14)
Anticardiolipin IgM: <9 MPL U/mL (ref 0–12)

## 2016-08-01 LAB — PROTEIN C, TOTAL: Protein C, Total: 104 % (ref 60–150)

## 2016-08-06 ENCOUNTER — Encounter: Payer: Self-pay | Admitting: Hematology and Oncology

## 2016-08-06 ENCOUNTER — Inpatient Hospital Stay: Payer: Managed Care, Other (non HMO)

## 2016-08-06 ENCOUNTER — Inpatient Hospital Stay (HOSPITAL_BASED_OUTPATIENT_CLINIC_OR_DEPARTMENT_OTHER): Payer: Managed Care, Other (non HMO) | Admitting: Hematology and Oncology

## 2016-08-06 VITALS — BP 133/84 | HR 77 | Temp 98.6°F | Resp 18 | Wt 202.8 lb

## 2016-08-06 DIAGNOSIS — R634 Abnormal weight loss: Secondary | ICD-10-CM

## 2016-08-06 DIAGNOSIS — Z86718 Personal history of other venous thrombosis and embolism: Secondary | ICD-10-CM | POA: Diagnosis not present

## 2016-08-06 DIAGNOSIS — Z885 Allergy status to narcotic agent status: Secondary | ICD-10-CM

## 2016-08-06 DIAGNOSIS — E669 Obesity, unspecified: Secondary | ICD-10-CM | POA: Diagnosis not present

## 2016-08-06 DIAGNOSIS — Z79899 Other long term (current) drug therapy: Secondary | ICD-10-CM

## 2016-08-06 DIAGNOSIS — D6862 Lupus anticoagulant syndrome: Secondary | ICD-10-CM

## 2016-08-06 DIAGNOSIS — I10 Essential (primary) hypertension: Secondary | ICD-10-CM | POA: Diagnosis not present

## 2016-08-06 DIAGNOSIS — I82612 Acute embolism and thrombosis of superficial veins of left upper extremity: Secondary | ICD-10-CM

## 2016-08-06 DIAGNOSIS — Z7901 Long term (current) use of anticoagulants: Secondary | ICD-10-CM | POA: Diagnosis not present

## 2016-08-06 LAB — FIBRIN DERIVATIVES D-DIMER (ARMC ONLY): Fibrin derivatives D-dimer (ARMC): 153.22 (ref 0.00–499.00)

## 2016-08-06 NOTE — Progress Notes (Signed)
Patient offers no complaints today. 

## 2016-08-06 NOTE — Progress Notes (Signed)
[facility] day:  08/06/2016  Chief Complaint: Christine Carroll is a 56 y.o. 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  HPI:  The patient was last seen in the hematology clinic on 05/11/2016.  At that time, she was denied any complaint.  She was on Xarelto. She had positive lupus anticoagulant testing while on Xarelto.  We discussed future testing off of Xarelto.  Labs on 07/30/2016 revealed a negative lupus anticoagulantpanel.  Anticardiolipin antibodies were negative.  Protein C activity was 127%.  Protein C total was 104%.  Protein S total antigen was 82% and free antigen was 107%.  Chest CT without contrast on 07/31/2016 revealed changes consistent with prior granulomatous disease. There were no new parenchymal nodules.  There was resolution of previously seen right basilar infiltrate.  Symptomatically, she feels "pretty good".  She denies any arm swelling.  She is back on Xarelto after her testing.  She notes a spot on her back that is burning and tingling for which she is seeing her primary care physician.  She has been using a Lidoderm patch for 3-4 weeks.  She denies any vesicles.   Past Medical History:  Diagnosis Date  . Depression   . Herpetic whitlow   . Hypertension   . Migraines   . Obesity     Past Surgical History:  Procedure Laterality Date  . ABDOMINAL HYSTERECTOMY    . cesarean    . CHOLECYSTECTOMY    . COLONOSCOPY WITH PROPOFOL N/A 10/14/2015   Procedure: COLONOSCOPY WITH PROPOFOL;  Surgeon: Manya Silvas, MD;  Location[facility] ENDOSCOPY;  Service: Endoscopy;  Laterality: N/A;  . LAPAROSCOPIC HYSTERECTOMY N/A 04/04/2016   Procedure: HYSTERECTOMY TOTAL LAPAROSCOPIC/ BILATERAL SALPINGECTOMY;  Surgeon: [name], MD;  Location: [facility] ORS;  Service: Gynecology;  Laterality: N/A;  . TUBAL LIGATION      Family  History  Problem Relation Age of Onset  . Breast cancer Maternal Aunt   . Hypertension Mother   . Stroke Mother   . CAD Father   . Deep vein thrombosis Father   . Pulmonary embolism Sister   . Deep vein thrombosis Other     Social History:  reports that she has never smoked. She has never used smokeless tobacco. She reports that she does not drink alcohol or use drugs.  The patient lives in [place].  She is a Merchandiser, retail. The patient is alone today.  Allergies:  Allergies  Allergen Reactions  . Vicodin [Hydrocodone-Acetaminophen] Nausea And Vomiting    Current Medications: Current Outpatient Prescriptions  Medication Sig Dispense Refill  . acetaminophen (TYLENOL) 500 MG tablet Take 1,000 mg by mouth every 8 (eight) hours as needed.    . Biotin 10000 MCG TABS Take 1 tablet by mouth daily.    . Black Cohosh 20 MG TABS Take 20 mg by mouth.    . Calcium Carb-Cholecalciferol (CALCIUM 600 + D) 600-200 MG-UNIT TABS Take 1 tablet by mouth daily.    . citalopram (CELEXA) 40 MG tablet Take 40 mg by mouth daily.    . Cranberry 400 MG CAPS Take 400 capsules by mouth.    . ergocalciferol (VITAMIN D2) 50000 units capsule Take 50,000 Units by mouth once a week.    . Garlic 696 MG TABS Take 1 tablet by mouth daily.    . hydrochlorothiazide (MICROZIDE) 12.5 MG capsule Take 12.5 mg  by mouth daily.    . Rivaroxaban 15 & 20 MG TBPK Take as directed on package: Start with one 15mg  tablet by mouth twice a day with food. On Day 22, switch to one 20mg  tablet once a day with food. 51 each 0  . vitamin B-12 (CYANOCOBALAMIN) 1000 MCG tablet Take 1,000 mcg by mouth daily.    Marland Kitchen gabapentin (NEURONTIN) 800 MG tablet Take 1 tablet (800 mg total) by mouth at bedtime. 3 tablet 0  . lidocaine (LIDODERM) 5 %     . oxycodone (OXY-IR) 5 MG capsule Take 1 capsule (5 mg total) by mouth every 6 (six) hours as needed for pain. (Patient not taking: Reported on 05/11/2016) 10 capsule 0   No current  facility-administered medications for this visit.     Review of Systems:  GENERAL:  Feels "pretty good".  No fevers or sweats.  Weight down 14 pounds since last visit. PERFORMANCE STATUS (ECOG):  0 HEENT:  No visual changes, runny nose, sore throat, mouth sores or tenderness. Lungs: No shortness of breath or cough.  No hemoptysis. Cardiac:  No chest pain, palpitations, orthopnea, or PND. GI:  No nausea, vomiting, diarrhea, constipation, melena or hematochezia. GU:  No urgency, frequency, dysuria, or hematuria.  Intermittent vaginal spotting. Musculoskeletal:  No back pain.  Right knee problems.  No muscle tenderness.  Spot on back requiring Lidoderm patch (see HPI). Extremities:  No pain or swelling. Skin:  Granuloma annulare.  No rashes or skin changes. Neuro:  No headache, numbness or weakness, balance or coordination issues. Endocrine:  No diabetes, thyroid issues, hot flashes or night sweats.  Post-menopausal. Psych:  No mood changes, depression or anxiety. Pain:  No focal pain. Review of systems:  All other systems reviewed and found to be negative.  Physical Exam: Blood pressure 133/84, pulse 77, temperature 98.6 F (37 C), temperature source Tympanic, resp. rate 18, weight 202 lb 13.2 oz (92 kg). GENERAL:  Well developed, well nourished, woman sitting comfortably in the exam room in no acute distress. MENTAL STATUS:  Alert and oriented to person, place and time. HEAD:  Long light brown hair with slight graying.  Normocephalic, atraumatic, face symmetric, no Cushingoid features. EYES:  No conjunctivitis or scleral icterus. EXTREMITY:  No swelling left upper extremity. NEUROLOGICAL: Unremarkable. PSYCH:  Appropriate.   No visits with results within 3 Day(s) from this visit.  Latest known visit with results is:  Appointment on 07/30/2016  Component Date Value Ref Range Status  . PTT Lupus Anticoagulant 07/30/2016 28.3  0.0 - 51.9 sec Final  . DRVVT 07/30/2016 28.0  0.0 - 47.0  sec Final  . Lupus Anticoag Interp 07/30/2016 Comment:   Corrected   Comment: (NOTE) No lupus anticoagulant was detected. Performed At: [facility] [place], [place] [id number] Lindon Romp MD [id number]   . Anticardiolipin IgG 07/30/2016 <9  0 - 14 GPL U/mL Final   Comment: (NOTE)                          Negative:              <15                          Indeterminate:     15 - 20  Low-Med Positive: >20 - 80                          High Positive:         >80   . Anticardiolipin IgM 07/30/2016 <9  0 - 12 MPL U/mL Final   Comment: (NOTE)                          Negative:              <13                          Indeterminate:     13 - 20                          Low-Med Positive: >20 - 80                          High Positive:         >80   . Anticardiolipin IgA 07/30/2016 <9  0 - 11 APL U/mL Final   Comment: (NOTE)                          Negative:              <12                          Indeterminate:     12 - 20                          Low-Med Positive: >20 - 80                          High Positive:         >80 Performed At: [facility] [place], [place] [id number] Lindon Romp MD [id number]   . Protein C, Total 07/30/2016 104  60 - 150 % Final   Comment: (NOTE) Performed At: [facility] [place], [place] [id number] Lindon Romp MD [id number]   . Protein C Activity 07/30/2016 127  73 - 180 % Final   Comment: (NOTE) Performed At: [facility] [place], [place] [id number] Lindon Romp MD [id number]   . Protein S Ag, Total 07/30/2016 82  60 - 150 % Final   Comment: (NOTE) This test was developed and its performance characteristics determined by [facility]. It has not been cleared or approved by the Food and Drug Administration.   . Protein S Ag, Free 07/30/2016 107  57 - 157 % Final   Comment: (NOTE) This test was  developed and its performance characteristics determined by [facility]. It has not been cleared or approved by the Food and Drug Administration. Performed At: [facility] [address] [place], [place] [id number] Lindon Romp MD VF:[id number]     Assessment:  KAM KUSHNIR is a 56 y.o. female with a history of left upper extremity superficial thrombosis following peripheral IV access and TAH-BSO on 04/04/2016.  She is on Xarelto.  Left upper extremity duplex on 04/14/2016 revealed occlusive thrombus within the left cephalic and basilic veins. Thrombus extended from the mid  upper arm to the antecubital fossa at the left cephalic vein and involved most of the left basilic vein extending into the forearm.  Hypercoagulable work-up on 04/30/2016 revealed a positive lupus anticoagulant and beta-2 glycoprotein IgA of 28 (elevated).  The following studies were negative: CBC with diff, factor V Leiden, prothrombin gene mutation, anticardiolipin antibodies, ATIII activity (198%) and antigen (74%).  Labs on 07/30/2016 revealed the following negative studies:  lupus anticoagulant panel, anticardiolipin antibodies, protein C total (104%), protein C activity (127%), protein S total antigen (82%), and protein S free antigen (107%).    Bilateral diagnostic mammogram on 03/20/2016 revealed stable, probably benign masses within the upper outer quadrant of the left breast most likely benign cysts.  Chest CT on 07/31/2016 revealed changes consistent with prior granulomatous disease. There were no new parenchymal nodules.  There was resolution of previously seen right basilar infiltrate.  Family history is notable for thrombosis in her father (DVT in his 56s), sister (PE in her 23s), and daughter (DVTs in her 66s).  Symptomatically, she denies any left arm swelling.  She denies any bruising or bleeding.  Exam is unremarkable.  Plan: 1.  Discuss interval hypercoagulable work-up.  Discuss plan to check  beta-2 glycoprotein antibodies and D-dimer.  Anticipate discontinuation of Xarelto this week.  Discuss long term anticoagulation if patient develops a recurrent clot. 2.  Labs today: beta-2 glycoprotein antibodies, D-dimer. 3.  Call patient with results. 4.  If testing negative, discontinue Xarelto. 5.  RTC prn.  Addendum:  D-dimers were negative.   Lequita Asal, MD  08/06/2016, 8:50 AM

## 2016-08-08 LAB — BETA-2-GLYCOPROTEIN I ABS, IGG/M/A
Beta-2 Glyco I IgG: <9 GPI IgG units (ref 0–20)
Beta-2-Glycoprotein I IgA: 17 GPI IgA units (ref 0–25)
Beta-2-Glycoprotein I IgM: 10 GPI IgM units (ref 0–32)

## 2016-08-14 ENCOUNTER — Telehealth: Payer: Self-pay | Admitting: *Deleted

## 2016-08-14 NOTE — Telephone Encounter (Signed)
Attempted to reach patient, but got her VM and left message to call me back

## 2016-08-14 NOTE — Telephone Encounter (Signed)
Per Dr Mike Gip, ok to stop Xarelto and RTC prn

## 2016-08-14 NOTE — Telephone Encounter (Signed)
States she was told she would receive a call regarding stopping her Xarelto depending on the lab results. Please advise if she can stop Xarelto. She has no follow up appts scheduled

## 2016-08-14 NOTE — Telephone Encounter (Signed)
Patient returned call and was instructed to stop Xarelto and can FU PRN. She repeated to me

## 2017-03-11 ENCOUNTER — Other Ambulatory Visit: Payer: Self-pay | Admitting: Obstetrics and Gynecology

## 2017-03-11 DIAGNOSIS — N6320 Unspecified lump in the left breast, unspecified quadrant: Secondary | ICD-10-CM

## 2017-04-05 ENCOUNTER — Ambulatory Visit
Admission: RE | Admit: 2017-04-05 | Discharge: 2017-04-05 | Disposition: A | Discharge status: Home / Self Care | Payer: Managed Care, Other (non HMO) | Source: Ambulatory Visit | Attending: Obstetrics and Gynecology | Admitting: Obstetrics and Gynecology

## 2017-04-05 DIAGNOSIS — N6321 Unspecified lump in the left breast, upper outer quadrant: Secondary | ICD-10-CM | POA: Insufficient documentation

## 2017-04-05 DIAGNOSIS — N6320 Unspecified lump in the left breast, unspecified quadrant: Secondary | ICD-10-CM

## 2017-04-06 IMAGING — US US EXTREM  UP VENOUS*L*
1 series · 13 of 24 positions shown · non-contrast
Comparison: None.

CLINICAL DATA: Acute onset of erythema and swelling near the
antecubital fossa of the left arm. Recent IV catheter placement in
this region. Initial encounter.



[Series 1: us extrem up venous*left* · 0.09mm/px · 13 of 41 slices shown]
[im 1/41]
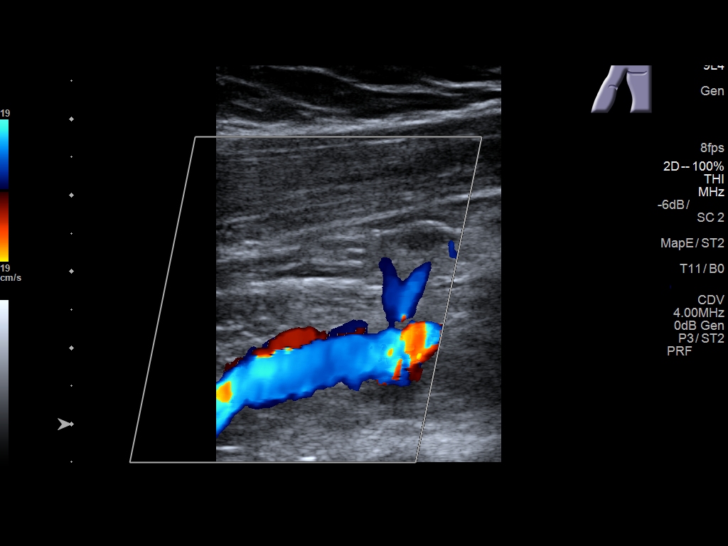
[im 4/41]
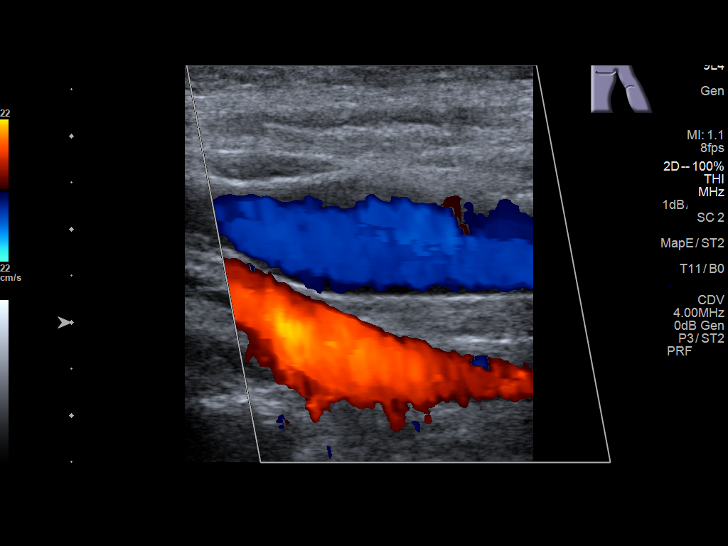
[im 7/41]
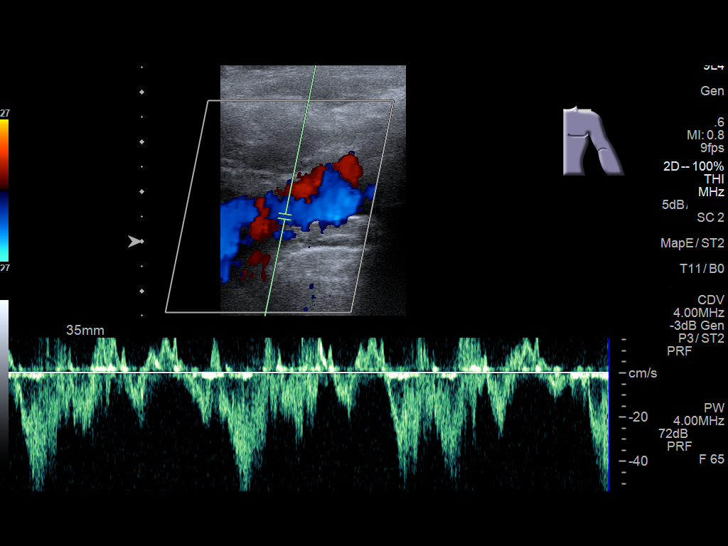
[im 11/41]
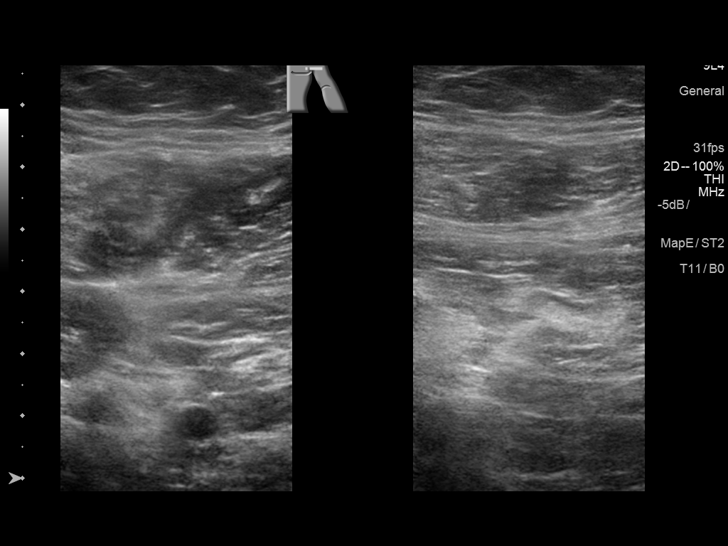
[im 14/41]
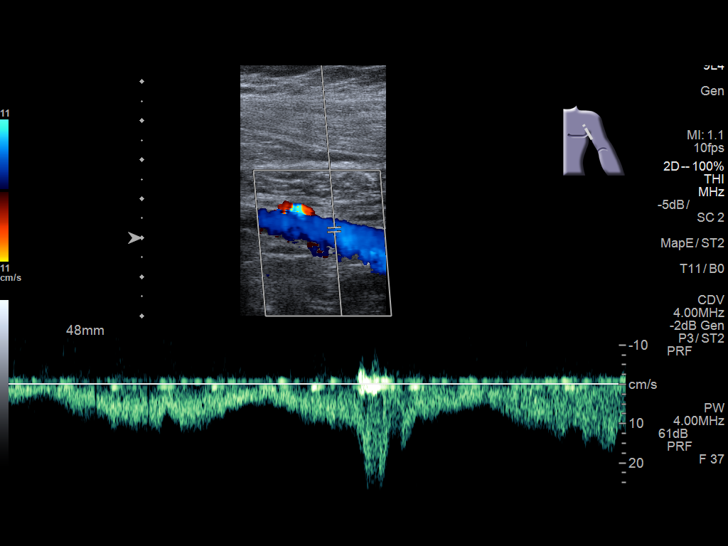
[im 18/41]
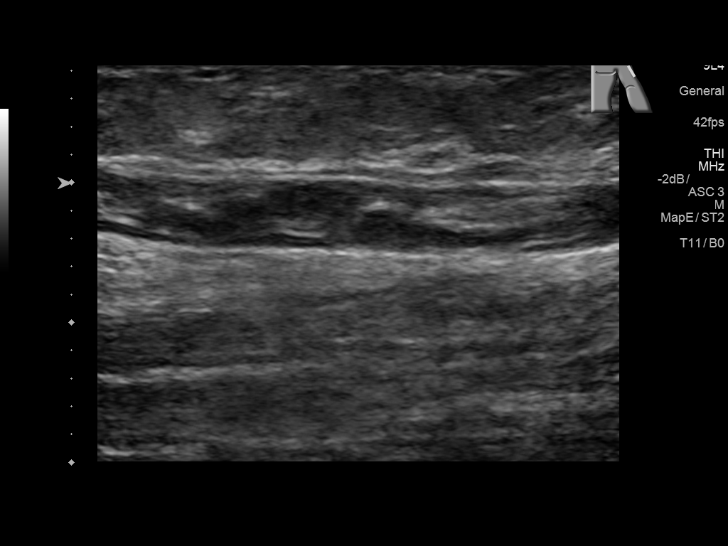
[im 21/41]
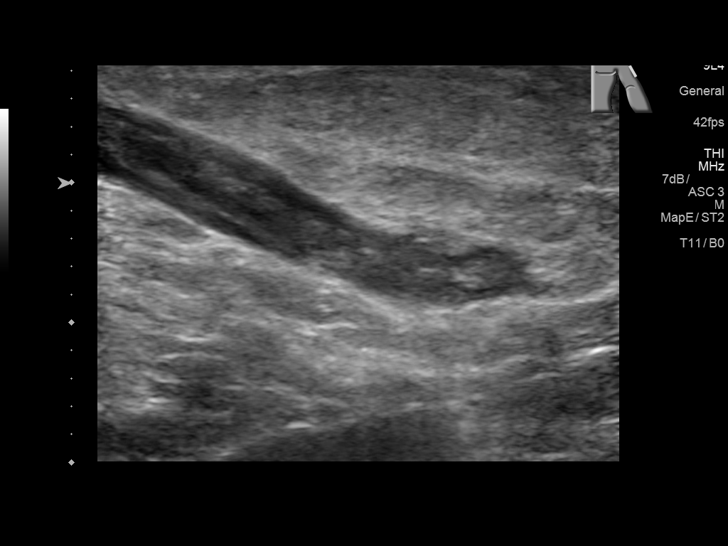
[im 23/41]
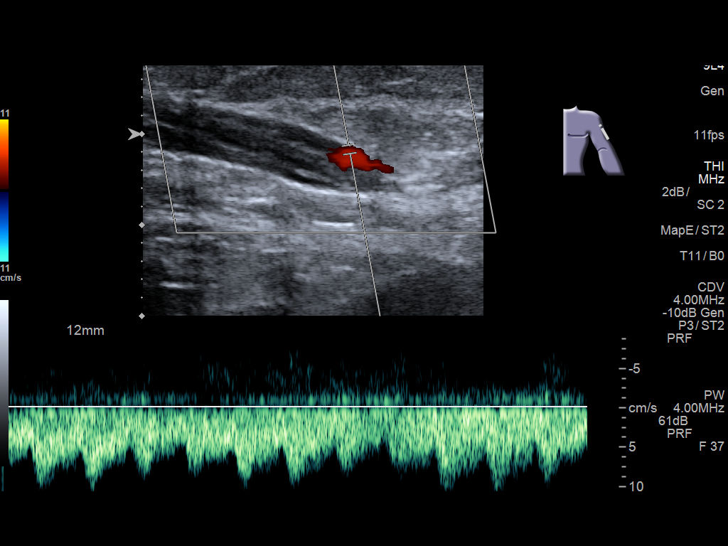
[im 27/41]
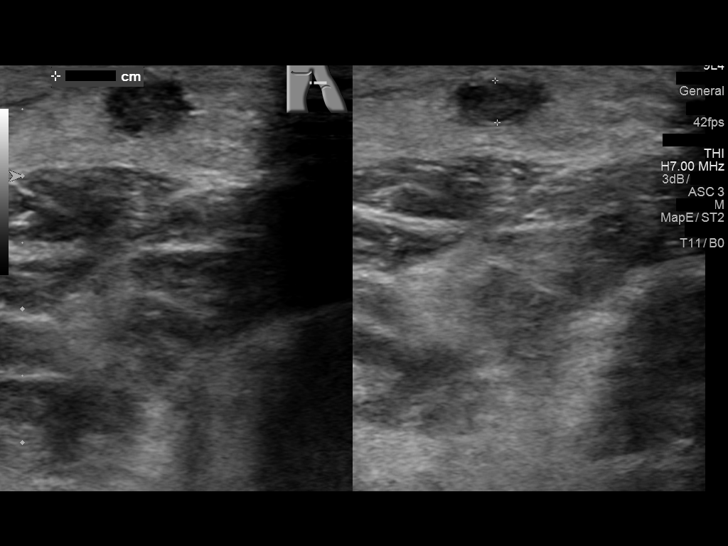
[im 30/41]
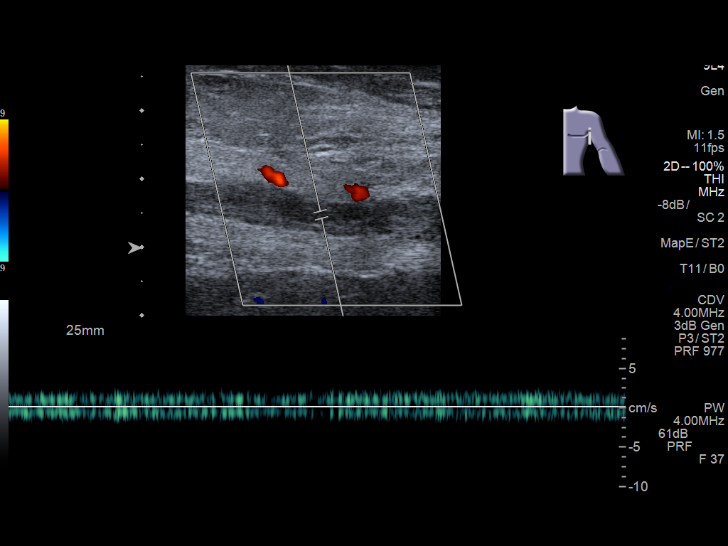
[im 34/41]
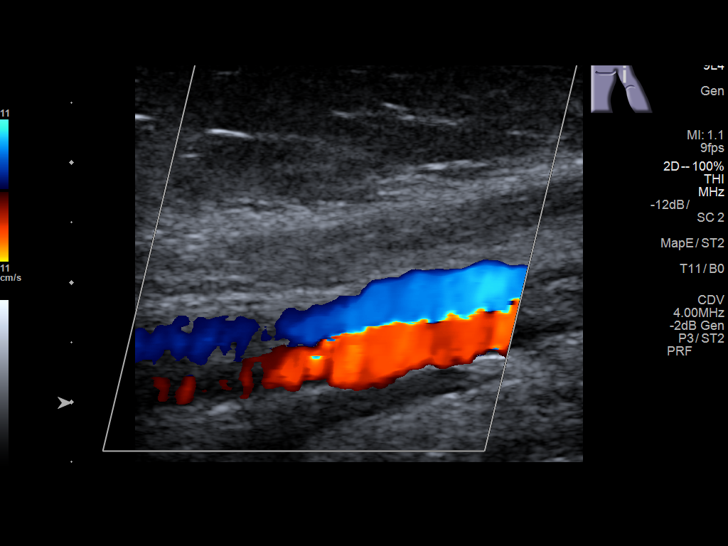
[im 37/41]
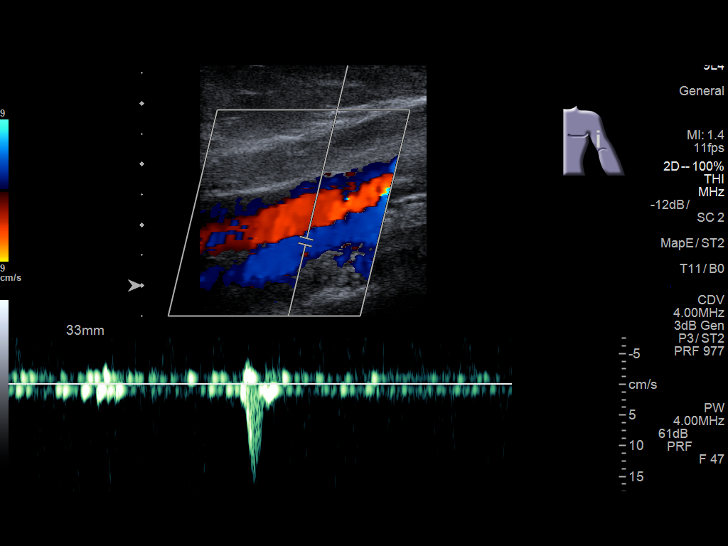
[im 41/41]
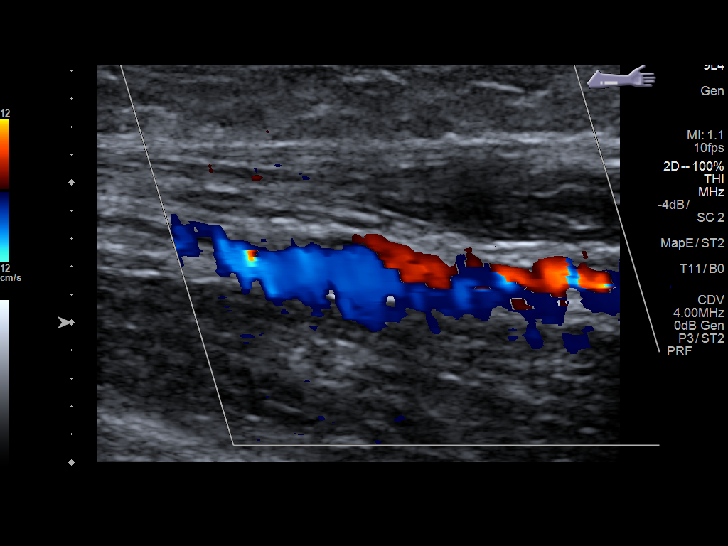

[13 of 24 positions shown; findings below may reference images not displayed]

FINDINGS: Contralateral Subclavian Vein: Respiratory phasicity is normal and
symmetric with the symptomatic side. No evidence of thrombus. Normal
compressibility.

Internal Jugular Vein: No evidence of thrombus. Normal
compressibility, respiratory phasicity and response to augmentation.

Subclavian Vein: No evidence of thrombus. Normal compressibility,
respiratory phasicity and response to augmentation.

Axillary Vein: No evidence of thrombus. Normal compressibility,
respiratory phasicity and response to augmentation.

Cephalic Vein: Occlusive thrombus is noted filling the left cephalic
vein, from the mid upper arm to the antecubital fossa.

Basilic Vein: Occlusive thrombus is noted filling the left basilic
vein along most of its length, extending into the forearm.

Brachial Veins: No evidence of thrombus. Normal compressibility,
respiratory phasicity and response to augmentation.

Radial Veins: No evidence of thrombus. Normal compressibility,
respiratory phasicity and response to augmentation.

Ulnar Veins: No evidence of thrombus. Normal compressibility,
respiratory phasicity and response to augmentation.

Venous Reflux:  None visualized.

Other Findings:  None visualized.
IMPRESSION: Occlusive thrombus noted within the left cephalic and basilic veins.
Thrombus extends from the mid upper arm to the antecubital fossa at
the left cephalic vein, and involves most of the left basilic vein,
extending into the forearm.

These results were called by telephone at the time of interpretation
on 04/14/2016 at [DATE] to ZOSSKY GUDZA PA, who verbally
acknowledged these results.

## 2018-03-12 ENCOUNTER — Other Ambulatory Visit: Payer: Self-pay | Admitting: Obstetrics and Gynecology

## 2018-03-12 DIAGNOSIS — Z1231 Encounter for screening mammogram for malignant neoplasm of breast: Secondary | ICD-10-CM

## 2018-04-11 ENCOUNTER — Ambulatory Visit
Admission: RE | Admit: 2018-04-11 | Discharge: 2018-04-11 | Disposition: A | Discharge status: Home / Self Care | Payer: Managed Care, Other (non HMO) | Source: Ambulatory Visit | Attending: Obstetrics and Gynecology | Admitting: Obstetrics and Gynecology

## 2018-04-11 DIAGNOSIS — Z1231 Encounter for screening mammogram for malignant neoplasm of breast: Secondary | ICD-10-CM | POA: Insufficient documentation

## 2019-05-27 ENCOUNTER — Other Ambulatory Visit: Payer: Self-pay | Admitting: Internal Medicine

## 2019-05-27 ENCOUNTER — Ambulatory Visit
Admission: RE | Admit: 2019-05-27 | Discharge: 2019-05-27 | Disposition: A | Discharge status: Home / Self Care | Payer: 59 | Source: Ambulatory Visit | Attending: Internal Medicine | Admitting: Internal Medicine

## 2019-05-27 DIAGNOSIS — Z1231 Encounter for screening mammogram for malignant neoplasm of breast: Secondary | ICD-10-CM

## 2019-08-10 ENCOUNTER — Emergency Department
Admission: EM | Admit: 2019-08-10 | Discharge: 2019-08-10 | Disposition: A | Discharge status: Home / Self Care | Payer: 59 | Attending: Emergency Medicine | Admitting: Emergency Medicine

## 2019-08-10 ENCOUNTER — Encounter: Payer: Self-pay | Admitting: Emergency Medicine

## 2019-08-10 ENCOUNTER — Emergency Department: Payer: 59

## 2019-08-10 ENCOUNTER — Other Ambulatory Visit: Payer: Self-pay

## 2019-08-10 DIAGNOSIS — R002 Palpitations: Secondary | ICD-10-CM | POA: Insufficient documentation

## 2019-08-10 DIAGNOSIS — Z5321 Procedure and treatment not carried out due to patient leaving prior to being seen by health care provider: Secondary | ICD-10-CM | POA: Insufficient documentation

## 2019-08-10 LAB — CBC WITH DIFFERENTIAL/PLATELET
Abs Immature Granulocytes: 0.05 10*3/uL (ref 0.00–0.07)
Basophils Absolute: 0.1 10*3/uL (ref 0.0–0.1)
Basophils Relative: 1 %
Eosinophils Absolute: 0.4 10*3/uL (ref 0.0–0.5)
Eosinophils Relative: 3 %
HCT: 39.9 % (ref 36.0–46.0)
Hemoglobin: 13.1 g/dL (ref 12.0–15.0)
Immature Granulocytes: 0 %
Lymphocytes Relative: 38 %
Lymphs Abs: 5.2 10*3/uL — ABNORMAL HIGH (ref 0.7–4.0)
MCH: 30.5 pg (ref 26.0–34.0)
MCHC: 32.8 g/dL (ref 30.0–36.0)
MCV: 92.8 fL (ref 80.0–100.0)
Monocytes Absolute: 0.9 10*3/uL (ref 0.1–1.0)
Monocytes Relative: 6 %
Neutro Abs: 7 10*3/uL (ref 1.7–7.7)
Neutrophils Relative %: 52 %
Platelets: 359 10*3/uL (ref 150–400)
RBC: 4.3 MIL/uL (ref 3.87–5.11)
RDW: 12.2 % (ref 11.5–15.5)
WBC: 13.7 10*3/uL — ABNORMAL HIGH (ref 4.0–10.5)
nRBC: 0 % (ref 0.0–0.2)

## 2019-08-10 LAB — COMPREHENSIVE METABOLIC PANEL
ALT: 17 U/L (ref 0–44)
AST: 19 U/L (ref 15–41)
Albumin: 4.2 g/dL (ref 3.5–5.0)
Alkaline Phosphatase: 73 U/L (ref 38–126)
Anion gap: 8 (ref 5–15)
BUN: 14 mg/dL (ref 6–20)
CO2: 29 mmol/L (ref 22–32)
Calcium: 9.1 mg/dL (ref 8.9–10.3)
Chloride: 106 mmol/L (ref 98–111)
Creatinine, Ser: 0.82 mg/dL (ref 0.44–1.00)
GFR calc Af Amer: >60 mL/min (ref >60)
GFR calc non Af Amer: >60 mL/min (ref >60)
Glucose, Bld: 100 mg/dL — ABNORMAL HIGH (ref 70–99)
Potassium: 3.7 mmol/L (ref 3.5–5.1)
Sodium: 143 mmol/L (ref 135–145)
Total Bilirubin: 0.5 mg/dL (ref 0.3–1.2)
Total Protein: 7 g/dL (ref 6.5–8.1)

## 2019-08-10 LAB — TROPONIN I (HIGH SENSITIVITY): Troponin I (High Sensitivity): <2 ng/L (ref <18)

## 2019-08-10 NOTE — ED Triage Notes (Signed)
Pt presents to ED from home with palpitations tonight since around 1830 and feeling "breathless" since onset. Denies chest pain. Pt states she checked her heart rate at home an dit was very irregular. had similar symptoms on Saturday for several hours which resolved on its own.

## 2019-08-11 ENCOUNTER — Telehealth: Payer: Self-pay | Admitting: Emergency Medicine

## 2019-08-11 NOTE — Telephone Encounter (Signed)
Called patient due to lwot to inquire about condition and follow up plans. She says she has a call in to dr Caryl Comes.   I told her that he can look at the test results from here.

## 2019-08-21 ENCOUNTER — Other Ambulatory Visit: Payer: Self-pay

## 2019-08-21 ENCOUNTER — Encounter: Payer: Self-pay | Admitting: Emergency Medicine

## 2019-08-21 ENCOUNTER — Emergency Department
Admission: EM | Admit: 2019-08-21 | Discharge: 2019-08-21 | Disposition: A | Discharge status: Home / Self Care | Payer: 59 | Attending: Emergency Medicine | Admitting: Emergency Medicine

## 2019-08-21 ENCOUNTER — Emergency Department: Payer: 59

## 2019-08-21 DIAGNOSIS — M545 Low back pain, unspecified: Secondary | ICD-10-CM

## 2019-08-21 DIAGNOSIS — I10 Essential (primary) hypertension: Secondary | ICD-10-CM | POA: Diagnosis not present

## 2019-08-21 DIAGNOSIS — Z79899 Other long term (current) drug therapy: Secondary | ICD-10-CM | POA: Insufficient documentation

## 2019-08-21 DIAGNOSIS — M461 Sacroiliitis, not elsewhere classified: Secondary | ICD-10-CM | POA: Insufficient documentation

## 2019-08-21 MED ORDER — KETOROLAC TROMETHAMINE 30 MG/ML IJ SOLN
30.0000 mg | Freq: Once | INTRAMUSCULAR | Status: AC
  Administered 2019-08-21: 21:00:00 30 mg via INTRAVENOUS
  Filled 2019-08-21: qty 1

## 2019-08-21 MED ORDER — OXYCODONE-ACETAMINOPHEN 5-325 MG PO TABS: 1.0000 | ORAL_TABLET | Freq: Four times a day (QID) | ORAL | 0 refills | Status: AC | PRN

## 2019-08-21 MED ORDER — PREDNISONE 10 MG PO TABS: ORAL_TABLET | ORAL | 0 refills | Status: DC

## 2019-08-21 MED ORDER — MORPHINE SULFATE (PF) 4 MG/ML IV SOLN
4.0000 mg | Freq: Once | INTRAVENOUS | Status: AC
  Administered 2019-08-21: 21:00:00 4 mg via INTRAVENOUS
  Filled 2019-08-21: qty 1

## 2019-08-21 MED ORDER — DEXAMETHASONE SODIUM PHOSPHATE 10 MG/ML IJ SOLN
10.0000 mg | Freq: Once | INTRAMUSCULAR | Status: AC
  Administered 2019-08-21: 22:00:00 10 mg via INTRAVENOUS
  Filled 2019-08-21: qty 1

## 2019-08-21 MED ORDER — ORPHENADRINE CITRATE 30 MG/ML IJ SOLN
60.0000 mg | Freq: Once | INTRAMUSCULAR | Status: AC
  Administered 2019-08-21: 21:00:00 60 mg via INTRAVENOUS
  Filled 2019-08-21: qty 2

## 2019-08-21 NOTE — ED Provider Notes (Signed)
[facility] EM[facility]GENCY DEPARTMENT Provider Note   CSN: [id number] Arrival date & time: 08/21/19  1839     History Chief Complaint  Patient presents with  . Back Pain    Christine Carroll is a 59 y.o. female presents to the emergency department for evaluation of acute right lower back pain.  She points to her SI joint.  Pain is localized occasionally radiates to the top of the buttocks but for the most part stays along the right SI joint.  She denies any abdominal pain, urinary symptoms, fevers, nausea or vomiting.  Symptoms been present for 1 week.  She has tried Voltaren gel, lidocaine patches and etodolac with only mild relief.  At times she will feel tightness.  No numbness tingling or radicular symptoms.  No trauma or injury.  She has had negative urinalysis by PCP looking for kidney stones.  HPI     Past Medical History:  Diagnosis Date  . Depression   . Herpetic whitlow   . Hypertension   . Migraines   . Obesity     Patient Active Problem List   Diagnosis Date Noted  . Acute thrombosis of left basilic vein 99991111  . Leukocytosis 04/17/2016  . 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 Abnormal uterine bleeding unrelated to menstrual cycle 04/03/2016    Past Surgical History:  Procedure Laterality Date  . ABDOMINAL HYSTERECTOMY    . cesarean    . CHOLECYSTECTOMY    . COLONOSCOPY WITH PROPOFOL N/A 10/14/2015   Procedure: COLONOSCOPY WITH PROPOFOL;  Surgeon: Manya Silvas, MD;  Location: [facility] ENDOSCOPY;  Service: Endoscopy;  Laterality: N/A;  . LAPAROSCOPIC HYSTERECTOMY N/A 04/04/2016   Procedure: HYSTERECTOMY TOTAL LAPAROSCOPIC/ BILATERAL SALPINGECTOMY;  Surgeon: [name], MD;  Location: [facility] ORS;  Service: Gynecology;  Laterality: N/A;  . OOPHORECTOMY     one ovary left  . TUBAL LIGATION       OB History   No obstetric history on file.     Family History  Problem Relation Age of Onset  . Breast cancer Maternal Aunt   .  Hypertension Mother   . Stroke Mother   . CAD Father   . Deep vein thrombosis Father   . Pulmonary embolism Sister   . Deep vein thrombosis Other     Social History   Tobacco Use  . Smoking status: Never Smoker  . Smokeless tobacco: Never Used  Substance Use Topics  . Alcohol use: No  . Drug use: No    Home Medications Prior to Admission medications   Medication Sig Start Date End Date Taking? Authorizing Provider  acetaminophen (TYLENOL) 500 MG tablet Take 1,000 mg by mouth every 8 (eight) hours as needed.    [provider]  Biotin 10000 MCG TABS Take 1 tablet by mouth daily.    [provider]  Black Cohosh 20 MG TABS Take 20 mg by mouth.    [provider]  Calcium Carb-Cholecalciferol (CALCIUM 600 + D) 600-200 MG-UNIT TABS Take 1 tablet by mouth daily.    [provider]  citalopram (CELEXA) 40 MG tablet Take 40 mg by mouth daily.    [provider]  Cranberry 400 MG CAPS Take 400 capsules by mouth.    [provider]  ergocalciferol (VITAMIN D2) 50000 units capsule Take 50,000 Units by mouth once a week.    [provider]  gabapentin (NEURONTIN) 800 MG tablet Take 1 tablet (800 mg total) by mouth at bedtime.  04/04/16 04/07/16  [name], MD  Garlic [id number] MG TABS Take 1 tablet by mouth daily.    [provider]  hydrochlorothiazide (MICROZIDE) 12.5 MG capsule Take 12.5 mg by mouth daily.    [provider]  lidocaine (LIDODERM) 5 %  07/25/16   [provider]  oxycodone (OXY-IR) 5 MG capsule Take 1 capsule (5 mg total) by mouth every 6 (six) hours as needed for pain. Patient not taking: Reported on 05/11/2016 04/04/16   [name], MD  oxyCODONE-acetaminophen (PERCOCET) 5-325 MG tablet Take 1 tablet by mouth every 6 (six) hours as needed for severe pain. 08/21/19 08/20/20  Duanne Guess, PA-C  predniSONE (DELTASONE) 10 MG tablet 10 day taper. 5,5,4,4,3,3,2,2,1,1 08/21/19    Duanne Guess, PA-C  vitamin B-12 (CYANOCOBALAMIN) 1000 MCG tablet Take 1,000 mcg by mouth daily.    [provider]    Allergies    Vicodin [hydrocodone-acetaminophen] and Wellbutrin [bupropion]  Review of Systems   Review of Systems  Constitutional: Negative for fever.  Respiratory: Negative for cough and shortness of breath.   Cardiovascular: Negative for chest pain.  Gastrointestinal: Negative for abdominal pain, nausea and vomiting.  Genitourinary: Negative for frequency and hematuria.  Musculoskeletal: Positive for arthralgias and back pain. Negative for gait problem, joint swelling and myalgias.  Skin: Negative for rash and wound.  Neurological: Negative for dizziness and headaches.    Physical Exam Updated Vital Signs BP (!) 155/84 (BP Location: Right Arm)   Pulse 81   Temp 97.7 F (36.5 C) (Oral)   Resp 16   Ht 5\' 4"  (1.626 m)   Wt 95.3 kg   SpO2 99%   BMI 36.05 kg/m   Physical Exam Constitutional:      Appearance: She is well-developed.  HENT:     Head: Normocephalic and atraumatic.  Eyes:     Conjunctiva/sclera: Conjunctivae normal.  Cardiovascular:     Rate and Rhythm: Normal rate.  Pulmonary:     Effort: Pulmonary effort is normal. No respiratory distress.  Abdominal:     General: There is no distension.     Palpations: Abdomen is soft. There is no mass.     Tenderness: There is no abdominal tenderness. There is no guarding or rebound.  Musculoskeletal:     Cervical back: Normal range of motion.     Comments: Lumbar Spine: Examination of the lumbar spine reveals no bony abnormality, no edema, and no ecchymosis.  There is no step off.  The patient has full range of motion of the lumbar spine with flexion and extension.  The patient has normal lateral bend and rotation.  Pain with lumbar extension along the right SI joint.  The patient has a negative axial load test, and a negative rotational Waddell test.  The patient is non tender along the  spinous process.  The patient is non tender along the paravertebral muscles, with no muscle spasms.  The patient is non tender along the iliac crest.  The patient is non tender in the sciatic notch.  Point tender along the right SI joint with no swelling warmth erythema.  No left SI joint tenderness.  There is no Coccyx joint tenderness.    Bilateral Lower Extremities: Examination of the lower extremities reveals no bony abnormality, no edema, and no ecchymosis.  The patient has full active and passive range of motion of the hips, knees, and ankles.  There is no discomfort with range of motion exercises.  The patient is non  tender along the greater trochanter region.  The patient has a negative Bevelyn Buckles' test bilaterally.  There is normal skin warmth.  There is normal capillary refill bilaterally.    Neurologic: The patient has a negative straight leg raise.  The patient has normal muscle strength testing for the quadriceps, calves, ankle dorsiflexion, ankle plantarflexion, and extensor hallicus longus.  The patient has sensation that is intact to light touch.    Skin:    General: Skin is warm.     Findings: No rash.  Neurological:     Mental Status: She is alert and oriented to person, place, and time.  Psychiatric:        Behavior: Behavior normal.        Thought Content: Thought content normal.     ED Results / Procedures / Treatments   Labs (all labs ordered are listed, but only abnormal results are displayed) Labs Reviewed - No data to display  EKG None  Radiology DG Lumbar Spine Complete  Result Date: 08/21/2019 CLINICAL DATA:  Lower back pain EXAM: LUMBAR SPINE - COMPLETE 4+ VIEW COMPARISON:  None. FINDINGS: There is no evidence of lumbar spine fracture. Alignment is normal. Mild disc height loss with facet arthrosis most notable at L5-S1. IMPRESSION: No acute fracture or malalignment. Electronically Signed   By: Prudencio Pair M.D.   On: 08/21/2019 19:32    Procedures Procedures  (including critical care time)  Medications Ordered in ED Medications  orphenadrine (NORFLEX) injection 60 mg (60 mg Intravenous Given 08/21/19 2054)  morphine 4 MG/ML injection 4 mg (4 mg Intravenous Given 08/21/19 2056)  ketorolac (TORADOL) 30 MG/ML injection 30 mg (30 mg Intravenous Given 08/21/19 2053)  dexamethasone (DECADRON) injection 10 mg (10 mg Intravenous Given 08/21/19 2149)    ED Course  I have reviewed the triage vital signs and the nursing notes.  Pertinent labs & imaging results that were available during my care of the patient were reviewed by me and considered in my medical decision making (see chart for details).    MDM Rules/Calculators/A&P                      59 year old female with acute right lower back pain, physical exam history consistent with sacroiliitis.  X-rays reviewed by me show no compression deformity, mild lower lumbar facet arthritis.  No spondylolisthesis.  Vital signs are stable, afebrile.  No abdominal symptoms.  She is given Norflex, morphine, Toradol and dexamethasone with significant improvement of right SI joint pain.  She is given prescription for oxycodone and prednisone to take at home if symptoms persist, discontinue etodolac.  Follow-up with orthopedics if no improvement 1 week.  She understands signs symptoms return to ED for. Final Clinical Impression(s) / ED Diagnoses Final diagnoses:  Acute right-sided low back pain without sciatica  Sacroiliitis ([place])    Rx / DC Orders ED Discharge Orders         Ordered    predniSONE (DELTASONE) 10 MG tablet     08/21/19 2150    oxyCODONE-acetaminophen (PERCOCET) 5-325 MG tablet  Every 6 hours PRN     08/21/19 2150           Renata Caprice 08/21/19 2201    Earleen Newport, MD 08/22/19 [phone]

## 2019-08-21 NOTE — ED Triage Notes (Signed)
First Nurse Note:  ARrives from [facility] for evaluation of worsening severe back pain.  Per report, provider at [facility] is concerned for "Aortic torsion".    Patient is AAOx3.  Skin warm and dry.  MAE equally and strong.  Tearful in [place]. NAD

## 2019-08-21 NOTE — ED Notes (Addendum)
Pt reports pain to lower back for a week today pain has increased denies any injury. Pt lying on left side. Uncomfortable, Pt took tylenol arthritis, lidocaine patch, saw Dr. Cleda Mccreedy last week.

## 2019-08-21 NOTE — ED Notes (Signed)
Spoke with Dr. Bland Span about pt. No labs needed at this time, start with X-ray. [place] for flex

## 2019-08-21 NOTE — Discharge Instructions (Signed)
Please take medications as prescribed.  Return to the ER for any severe increasing pain, abdominal pain, fevers, worsening symptoms or urgent changes in your health.  If no improvement in 7 to 10 days please follow-up with orthopedics.

## 2019-08-21 NOTE — ED Triage Notes (Signed)
Pt to ED via POV c/o back pain x 1 week. Pt states that she saw her PCP yesterday. Pt was given flexeril and Lodine. Pt states that she has also used heating pad, and 2600 mg of Tylenol Arthritis without any relief. Pt states that she was doing some stretches a directed by Dr. Caryl Comes and the pain got worse. Pt states that the pain is 10/10. Pt has hx/o clotting issues and has had blood clots in the past. Pt went to [facility] and they sent her here because they were concerned that she may have an Aortic Torsion.

## 2020-05-17 ENCOUNTER — Other Ambulatory Visit: Payer: Self-pay | Admitting: Internal Medicine

## 2020-05-17 DIAGNOSIS — Z1231 Encounter for screening mammogram for malignant neoplasm of breast: Secondary | ICD-10-CM

## 2020-06-22 ENCOUNTER — Other Ambulatory Visit: Payer: Self-pay

## 2020-06-22 ENCOUNTER — Ambulatory Visit
Admission: RE | Admit: 2020-06-22 | Discharge: 2020-06-22 | Disposition: A | Discharge status: Home / Self Care | Payer: 59 | Source: Ambulatory Visit | Attending: Internal Medicine | Admitting: Internal Medicine

## 2020-06-22 DIAGNOSIS — Z1231 Encounter for screening mammogram for malignant neoplasm of breast: Secondary | ICD-10-CM | POA: Diagnosis not present

## 2021-12-26 ENCOUNTER — Ambulatory Visit: Payer: 59

## 2022-02-02 ENCOUNTER — Other Ambulatory Visit: Payer: Self-pay

## 2022-02-02 DIAGNOSIS — Z1231 Encounter for screening mammogram for malignant neoplasm of breast: Secondary | ICD-10-CM

## 2022-02-02 NOTE — Addendum Note (Signed)
Addended by: Demetrius Revel on: 02/02/2022 01:15 PM   Modules accepted: Orders

## 2022-02-06 ENCOUNTER — Ambulatory Visit: Payer: Self-pay | Attending: Hematology and Oncology | Admitting: *Deleted

## 2022-02-06 ENCOUNTER — Encounter: Payer: Self-pay | Admitting: *Deleted

## 2022-02-06 ENCOUNTER — Ambulatory Visit
Admission: RE | Admit: 2022-02-06 | Discharge: 2022-02-06 | Disposition: A | Discharge status: Home / Self Care | Payer: Self-pay | Source: Ambulatory Visit | Attending: Obstetrics and Gynecology | Admitting: Obstetrics and Gynecology

## 2022-02-06 VITALS — BP 138/83 | Wt 221.0 lb

## 2022-02-06 DIAGNOSIS — Z1231 Encounter for screening mammogram for malignant neoplasm of breast: Secondary | ICD-10-CM | POA: Insufficient documentation

## 2022-02-06 DIAGNOSIS — Z01419 Encounter for gynecological examination (general) (routine) without abnormal findings: Secondary | ICD-10-CM

## 2022-02-06 NOTE — Progress Notes (Signed)
[name] is a 61 y.o. female who presents to [facility] clinic today with no complaints.   Patient presents for clinical breast exam and mammogram.    Pap Smear: Pap not smear completed today. Patient with a history of a hysterectomy on 04/04/16 for post menopausal bleeding. Per patient has no history of an abnormal Pap smear. Last Pap smear result is not available in Epic.   Physical exam: Breasts Breasts symmetrical. No skin abnormalities bilateral breasts. No nipple retraction bilateral breasts. No nipple discharge bilateral breasts. No lymphadenopathy. No lumps palpated bilateral breasts.       Pelvic/Bimanual Pap is not indicated today    Smoking History: Patient has never smoked    Patient Navigation: Patient education provided.  Taught self breast awareness.  Access to services provided for patient through [facility] program. No interpreter provided. No transportation provided   Colorectal Cancer Screening: Per patient has had colonoscopy completed on 10/14/15 with a tubular adenoma.  She is followed by Dr. Caryl Comes and was given a stool card.  No complaints today.    Breast and Cervical Cancer Risk Assessment: Patient does not have family history of breast cancer, known genetic mutations, or radiation treatment to the chest before age 55. Patient does not have history of cervical dysplasia, immunocompromised, or DES exposure in-utero.  Risk Assessment     Risk Scores       02/06/2022   Last edited by: Demetrius Revel, LPN   5-year risk: 1.2 %   Lifetime risk: 5.8 %            A: [facility] exam without pap smear   P: Referred patient to the [facility] for a screening mammogram. Appointment scheduled today.  Will follow up per [facility] protocol.  Rico Junker, RN 02/06/2022 2:06 PM

## 2022-02-08 ENCOUNTER — Other Ambulatory Visit: Payer: Self-pay | Admitting: Obstetrics and Gynecology

## 2022-02-08 DIAGNOSIS — R928 Other abnormal and inconclusive findings on diagnostic imaging of breast: Secondary | ICD-10-CM

## 2022-02-08 DIAGNOSIS — N63 Unspecified lump in unspecified breast: Secondary | ICD-10-CM

## 2022-02-28 ENCOUNTER — Ambulatory Visit
Admission: RE | Admit: 2022-02-28 | Discharge: 2022-02-28 | Disposition: A | Discharge status: Home / Self Care | Payer: Medicaid Other | Source: Ambulatory Visit | Attending: Obstetrics and Gynecology | Admitting: Obstetrics and Gynecology

## 2022-02-28 DIAGNOSIS — N63 Unspecified lump in unspecified breast: Secondary | ICD-10-CM

## 2022-02-28 DIAGNOSIS — N6310 Unspecified lump in the right breast, unspecified quadrant: Secondary | ICD-10-CM | POA: Insufficient documentation

## 2022-02-28 DIAGNOSIS — Z1239 Encounter for other screening for malignant neoplasm of breast: Secondary | ICD-10-CM | POA: Diagnosis not present

## 2022-02-28 DIAGNOSIS — R928 Other abnormal and inconclusive findings on diagnostic imaging of breast: Secondary | ICD-10-CM

## 2022-03-01 ENCOUNTER — Other Ambulatory Visit: Payer: Self-pay | Admitting: Obstetrics and Gynecology

## 2022-03-01 DIAGNOSIS — R928 Other abnormal and inconclusive findings on diagnostic imaging of breast: Secondary | ICD-10-CM

## 2022-03-01 DIAGNOSIS — N63 Unspecified lump in unspecified breast: Secondary | ICD-10-CM

## 2022-03-13 ENCOUNTER — Ambulatory Visit
Admission: RE | Admit: 2022-03-13 | Discharge: 2022-03-13 | Disposition: A | Discharge status: Home / Self Care | Payer: Medicaid Other | Source: Ambulatory Visit | Attending: Obstetrics and Gynecology | Admitting: Obstetrics and Gynecology

## 2022-03-13 DIAGNOSIS — N6489 Other specified disorders of breast: Secondary | ICD-10-CM | POA: Insufficient documentation

## 2022-03-13 DIAGNOSIS — N63 Unspecified lump in unspecified breast: Secondary | ICD-10-CM

## 2022-03-13 DIAGNOSIS — R928 Other abnormal and inconclusive findings on diagnostic imaging of breast: Secondary | ICD-10-CM | POA: Diagnosis present

## 2022-03-13 DIAGNOSIS — Z1239 Encounter for other screening for malignant neoplasm of breast: Secondary | ICD-10-CM | POA: Diagnosis not present

## 2022-03-13 HISTORY — PX: BREAST BIOPSY: SHX20

## 2022-03-14 ENCOUNTER — Telehealth: Payer: Self-pay

## 2022-03-14 LAB — SURGICAL PATHOLOGY

## 2022-03-14 NOTE — Telephone Encounter (Signed)
Spoke to Christine Carroll regarding surgical referral for benign breast lesion. She does not have surgical preference. PCP is located at [facility]. Referral sent to [facility] surgery. They will contact her with appointment.

## 2022-03-22 ENCOUNTER — Ambulatory Visit: Payer: Self-pay | Admitting: General Surgery

## 2022-03-22 NOTE — H&P (View-Only) (Signed)
PATIENT PROFILE: Christine Carroll is a 61 y.o. female who presents to the Clinic for consultation at the request of Dr. Elly Modena for evaluation of right breast atypical hyperplasia.  PCP:  Cheryll Cockayne, MD  HISTORY OF PRESENT ILLNESS: [name] reports she had her usual screening mammogram.  On the screening mammogram she was found with a 13 mm mass in the right breast.  Diagnostic mammogram and breast ultrasound shows the 13 mm mass on the upper outer quadrant of the right breast.  Core biopsy was done showing fibroepithelial lesion with atypical ductal hyperplasia.  Patient denies any palpable masses, skin changes, nipple retraction or nipple discharge.  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  Evaluated by oncology; follow-up lupus anticoagulant test 3/18 was negative.  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  CPAP arranged       Abnormal uterine bleeding unrelated to menstrual cycle 04/03/2016   Depression, major, in remission (CMS-HCC) 08/05/2015   Seasonal allergic rhinitis 08/05/2015   HTN (hypertension), benign 01/09/2014   OA (osteoarthritis) 01/09/2014    GENERAL REVIEW OF SYSTEMS:   General ROS: negative for - chills, fatigue, fever, weight gain or weight loss Allergy and Immunology ROS: negative for - hives  Hematological and Lymphatic  ROS: negative for - bleeding problems or bruising, negative for palpable nodes Endocrine ROS: negative for - heat or cold intolerance, hair changes Respiratory ROS: negative for - cough, shortness of breath or wheezing Cardiovascular ROS: no chest pain or palpitations GI ROS: negative for nausea, vomiting, abdominal pain, diarrhea, constipation Musculoskeletal ROS: negative for - joint swelling or muscle pain Neurological ROS: negative for - confusion, syncope Dermatological ROS: negative for pruritus and rash Psychiatric: negative for anxiety, depression, difficulty sleeping and memory loss  MEDICATIONS: Current Outpatient Medications  Medication Sig Dispense Refill   ACETAMINOPHEN (TYLENOL ORAL) Take by mouth. '1000mg'$  every 8 hours as needed     aspirin 81 MG EC tablet Take 81 mg by mouth once daily     citalopram (CELEXA) 40 MG tablet Take 1 tablet (40 mg total) by mouth once daily 90 tablet 3   ergocalciferol, vitamin D2, 1,250 mcg (50,000 unit) capsule Take 1 capsule (50,000 Units total) by mouth once a week 12 capsule 3   hydroCHLOROthiazide (MICROZIDE) 12.5 mg capsule Take 1 capsule (12.5 mg total) by mouth once daily 90 capsule 3   nystatin (MYCOSTATIN) 100,000 unit/gram powder Apply topically 3 (three) times daily as needed 60 g 5   nystatin-triamcinolone cream APPLY TO AFFECTED AREAS 3 TIMES DAILY ASDIRECTED 60 g 1   busPIRone (BUSPAR) 7.5 MG tablet Take 1 tablet (7.5 mg total) by mouth 2 (two) times daily as needed (anxiety) (Patient not taking: Reported on 03/22/2022) 40 tablet 11  No current facility-administered medications for this visit.    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  Images 3/18 shows stability, with no further evaluation recommended.   Herpetic whitlow    Hyperglycemia 08/07/2015    Hypertension    Migraines    Obesity    OSA (obstructive sleep apnea) 04/12/2016   By sleep study 2017.  CPAP arranged    PAST SURGICAL HISTORY: Past Surgical History:  Procedure Laterality Date   COLONOSCOPY N/A 10/14/2015   Dr. Kennis Carina @ [place] - Adenomatous Polyp: CBF 09/2020   HYSTERECTOMY  03/2016   Fibroids and hemorrhagic cyst; no evidence of malignancy   CESAREAN SECTION     CHOLECYSTECTOMY     TUBAL LIGATION       FAMILY HISTORY: Family History  Problem Relation Age of Onset   High blood pressure (Hypertension) Mother    Diabetes Mother    Heart disease Father    Lung disease Father      SOCIAL HISTORY: Social History   Socioeconomic History   Marital status: Single  Tobacco Use   Smoking status: Never   Smokeless tobacco: Never  Vaping Use   Vaping Use: Never used  Substance and Sexual Activity   Alcohol use: No   Drug use: No   Sexual activity: Not Currently    Partners: Male    Birth control/protection: Post-menopausal    PHYSICAL EXAM: Vitals:   03/22/22 0909  BP: 126/78  Pulse: 87   Body mass index is 38.45 kg/m. Weight: (!) 101.6 kg (224 lb)   GENERAL: Alert, active, oriented x3  HEENT: Pupils equal reactive to light. Extraocular movements are intact. Sclera clear. Palpebral conjunctiva normal red color.Pharynx clear.  NECK: Supple with no palpable mass and no adenopathy.  LUNGS: Sound clear with no rales rhonchi or wheezes.  HEART: Regular rhythm S1 and S2 without murmur.  BREAST: breasts appear normal, no suspicious masses, no skin or nipple changes or axillary nodes.  ABDOMEN: Soft and depressible, nontender with no palpable mass, no hepatomegaly.  EXTREMITIES: Well-developed well-nourished symmetrical with no dependent edema.  NEUROLOGICAL: Awake alert oriented, facial expression symmetrical, moving all extremities.  REVIEW OF DATA: I have reviewed the following data today: No visits with results within 3 Month(s) from  this visit.  Latest known visit with results is:  Office Visit on 12/19/2021  Component Date Value   Hemoccult ICT 02/13/2022 Negative    Hemoccult ICT 02/13/2022 Negative      ASSESSMENT: Ms. Weedon is a 61 y.o. female presenting for consultation for right breast atypical hyperplasia.    Patient was oriented again about the pathology results. Surgical alternatives were discussed with patient including radiofrequency tag excisional biopsy. Surgical technique and post operative care was discussed with patient. Risk of surgery was discussed with patient including but not limited to: wound infection, seroma, hematoma, brachial plexopathy, mondor's disease (thrombosis of small veins of breast), chronic wound pain, breast lymphedema, altered sensation to the nipple and cosmesis among others.   Patient was oriented about the implication of having atypical ductal hyperplasia.  Goal of surgery explained to be ruling out malignancy at this moment.  I also discussed with patient referral to medical oncology after excision to at least discuss chemoprevention to reduce risk of breast cancer due to atypical hyperplasia.  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  Right breast radiofrequency tag excisional biopsy (19125) 2.  CBC, CMP 3.  Hold aspirin 5 days before the procedure 4.  Contact us if you have any concern  Patient and her husband verbalized understanding, all questions were answered, and were agreeable with the plan outlined above.     Herbert Pun, MD  Electronically signed by Herbert Pun, MD

## 2022-03-22 NOTE — H&P (Signed)
PATIENT PROFILE: Christine Carroll is a 61 y.o. female who presents to the Clinic for consultation at the request of Christine Carroll for evaluation of right breast atypical hyperplasia.  PCP:  Christine Cockayne, MD  HISTORY OF PRESENT ILLNESS: Christine Carroll reports she had her usual screening mammogram.  On the screening mammogram she was found with a 13 mm mass in the right breast.  Diagnostic mammogram and breast ultrasound shows the 13 mm mass on the upper outer quadrant of the right breast.  Core biopsy was done showing fibroepithelial lesion with atypical ductal hyperplasia.  Patient denies any palpable masses, skin changes, nipple retraction or nipple discharge.  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  Evaluated by oncology; follow-up lupus anticoagulant test 3/18 was negative.  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  CPAP arranged       Abnormal uterine bleeding unrelated to menstrual cycle 04/03/2016   Depression, major, in remission (CMS-HCC) 08/05/2015   Seasonal allergic rhinitis 08/05/2015   HTN (hypertension), benign 01/09/2014   OA (osteoarthritis) 01/09/2014    GENERAL REVIEW OF SYSTEMS:   General ROS: negative for - chills, fatigue, fever, weight gain or weight loss Allergy and Immunology ROS: negative for - hives  Hematological and Lymphatic  ROS: negative for - bleeding problems or bruising, negative for palpable nodes Endocrine ROS: negative for - heat or cold intolerance, hair changes Respiratory ROS: negative for - cough, shortness of breath or wheezing Cardiovascular ROS: no chest pain or palpitations GI ROS: negative for nausea, vomiting, abdominal pain, diarrhea, constipation Musculoskeletal ROS: negative for - joint swelling or muscle pain Neurological ROS: negative for - confusion, syncope Dermatological ROS: negative for pruritus and rash Psychiatric: negative for anxiety, depression, difficulty sleeping and memory loss  MEDICATIONS: Current Outpatient Medications  Medication Sig Dispense Refill   ACETAMINOPHEN (TYLENOL ORAL) Take by mouth. '1000mg'$  every 8 hours as needed     aspirin 81 MG EC tablet Take 81 mg by mouth once daily     citalopram (CELEXA) 40 MG tablet Take 1 tablet (40 mg total) by mouth once daily 90 tablet 3   ergocalciferol, vitamin D2, 1,250 mcg (50,000 unit) capsule Take 1 capsule (50,000 Units total) by mouth once a week 12 capsule 3   hydroCHLOROthiazide (MICROZIDE) 12.5 mg capsule Take 1 capsule (12.5 mg total) by mouth once daily 90 capsule 3   nystatin (MYCOSTATIN) 100,000 unit/gram powder Apply topically 3 (three) times daily as needed 60 g 5   nystatin-triamcinolone cream APPLY TO AFFECTED AREAS 3 TIMES DAILY ASDIRECTED 60 g 1   busPIRone (BUSPAR) 7.5 MG tablet Take 1 tablet (7.5 mg total) by mouth 2 (two) times daily as needed (anxiety) (Patient not taking: Reported on 03/22/2022) 40 tablet 11  No current facility-administered medications for this visit.    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  Images 3/18 shows stability, with no further evaluation recommended.   Herpetic whitlow    Hyperglycemia 08/07/2015    Hypertension    Migraines    Obesity    OSA (obstructive sleep apnea) 04/12/2016   By sleep study 2017.  CPAP arranged    PAST SURGICAL HISTORY: Past Surgical History:  Procedure Laterality Date   COLONOSCOPY N/A 10/14/2015   Dr. Kennis Carroll @ [place] - Adenomatous Polyp: CBF 09/2020   HYSTERECTOMY  03/2016   Fibroids and hemorrhagic cyst; no evidence of malignancy   CESAREAN SECTION     CHOLECYSTECTOMY     TUBAL LIGATION       FAMILY HISTORY: Family History  Problem Relation Age of Onset   High blood pressure (Hypertension) Mother    Diabetes Mother    Heart disease Father    Lung disease Father      SOCIAL HISTORY: Social History   Socioeconomic History   Marital status: Single  Tobacco Use   Smoking status: Never   Smokeless tobacco: Never  Vaping Use   Vaping Use: Never used  Substance and Sexual Activity   Alcohol use: No   Drug use: No   Sexual activity: Not Currently    Partners: Male    Birth control/protection: Post-menopausal    PHYSICAL EXAM: Vitals:   03/22/22 0909  BP: 126/78  Pulse: 87   Body mass index is 38.45 kg/m. Weight: (!) 101.6 kg (224 lb)   GENERAL: Alert, active, oriented x3  HEENT: Pupils equal reactive to light. Extraocular movements are intact. Sclera clear. Palpebral conjunctiva normal red color.Pharynx clear.  NECK: Supple with no palpable mass and no adenopathy.  LUNGS: Sound clear with no rales rhonchi or wheezes.  HEART: Regular rhythm S1 and S2 without murmur.  BREAST: breasts appear normal, no suspicious masses, no skin or nipple changes or axillary nodes.  ABDOMEN: Soft and depressible, nontender with no palpable mass, no hepatomegaly.  EXTREMITIES: Well-developed well-nourished symmetrical with no dependent edema.  NEUROLOGICAL: Awake alert oriented, facial expression symmetrical, moving all extremities.  REVIEW OF DATA: I have reviewed the following data today: No visits with results within 3 Month(s) from  this visit.  Latest known visit with results is:  Office Visit on 12/19/2021  Component Date Value   Hemoccult ICT 02/13/2022 Negative    Hemoccult ICT 02/13/2022 Negative      ASSESSMENT: Christine Carroll is a 61 y.o. female presenting for consultation for right breast atypical hyperplasia.    Patient was oriented again about the pathology results. Surgical alternatives were discussed with patient including radiofrequency tag excisional biopsy. Surgical technique and post operative care was discussed with patient. Risk of surgery was discussed with patient including but not limited to: wound infection, seroma, hematoma, brachial plexopathy, mondor's disease (thrombosis of small veins of breast), chronic wound pain, breast lymphedema, altered sensation to the nipple and cosmesis among others.   Patient was oriented about the implication of having atypical ductal hyperplasia.  Goal of surgery explained to be ruling out malignancy at this moment.  I also discussed with patient referral to medical oncology after excision to at least discuss chemoprevention to reduce risk of breast cancer due to atypical hyperplasia.  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  Right breast radiofrequency tag excisional biopsy (19125) 2.  CBC, CMP 3.  Hold aspirin 5 days before the procedure 4.  Contact us if you have any concern  Patient and her husband verbalized understanding, all questions were answered, and were agreeable with the plan outlined above.     Herbert Pun, MD  Electronically signed by Herbert Pun, MD

## 2022-03-23 ENCOUNTER — Other Ambulatory Visit: Payer: Self-pay | Admitting: General Surgery

## 2022-03-23 DIAGNOSIS — N6091 Unspecified benign mammary dysplasia of right breast: Secondary | ICD-10-CM

## 2022-03-29 ENCOUNTER — Telehealth: Payer: Self-pay

## 2022-03-29 NOTE — Telephone Encounter (Signed)
Attempted to contact patient regarding [facility] Medicaid application. Left message on identifying voicemail requesting a return call.

## 2022-04-09 ENCOUNTER — Encounter
Admission: RE | Admit: 2022-04-09 | Discharge: 2022-04-09 | Disposition: A | Discharge status: Home / Self Care | Payer: Self-pay | Source: Ambulatory Visit | Attending: General Surgery | Admitting: General Surgery

## 2022-04-09 ENCOUNTER — Encounter: Payer: Self-pay | Admitting: General Surgery

## 2022-04-09 ENCOUNTER — Other Ambulatory Visit: Payer: Self-pay

## 2022-04-09 VITALS — Ht 64.0 in | Wt 200.0 lb

## 2022-04-09 DIAGNOSIS — I82612 Acute embolism and thrombosis of superficial veins of left upper extremity: Secondary | ICD-10-CM

## 2022-04-09 DIAGNOSIS — R7303 Prediabetes: Secondary | ICD-10-CM

## 2022-04-09 HISTORY — DX: Sleep apnea, unspecified: G47.30

## 2022-04-09 HISTORY — DX: Prediabetes: R73.03

## 2022-04-09 NOTE — Pre-Procedure Instructions (Signed)
Has been recommended to use CPAP for  OSA but not currently using CPAP as of 04/09/2022

## 2022-04-09 NOTE — Patient Instructions (Signed)
Your procedure is scheduled on: 04/18/2022  Report to the Registration Desk on the 1st floor of the [facility]. To find out your arrival time, please call [phone] between 1PM - 3PM on: 04/17/2022  If your arrival time is 6:00 am, do not arrive prior to that time as the [facility] entrance doors do not open until 6:00 am.  REMEMBER: Instructions that are not followed completely may result in serious medical risk, up to and including death; or upon the discretion of your surgeon and anesthesiologist your surgery may need to be rescheduled.  Do not eat food after midnight the night before surgery.  No gum chewing, lozengers or hard candies.   TAKE THESE MEDICATIONS THE MORNING OF SURGERY WITH A SIP OF WATER: CELEXA    Follow recommendations from Cardiologist, Pulmonologist or PCP regarding stopping Aspirin.  One week prior to surgery: Stop Anti-inflammatories (NSAIDS) such as Advil, Aleve, Ibuprofen, Motrin, Naproxen, Naprosyn and Aspirin based products such as Excedrin, Goodys Powder, BC Powder. Stop ANY OVER THE COUNTER supplements until after surgery. You may however, continue to take Tylenol if needed for pain up until the day of surgery.  No Alcohol for 24 hours before or after surgery.  No Smoking including e-cigarettes for 24 hours prior to surgery.  No chewable tobacco products for at least 6 hours prior to surgery.  No nicotine patches on the day of surgery.  Do not use any "recreational" drugs for at least a week prior to your surgery.  Please be advised that the combination of cocaine and anesthesia may have negative outcomes, up to and including death. If you test positive for cocaine, your surgery will be cancelled.  On the morning of surgery brush your teeth with toothpaste and water, you may rinse your mouth with mouthwash if you wish. Do not swallow any toothpaste or mouthwash.  Use CHG Soap directed on instruction sheet.-PROVIDED FOR YOU   Do not wear  jewelry, make-up, hairpins, clips or nail polish.  Do not wear lotions, powders, or perfumes.   Do not shave body from the neck down 48 hours prior to surgery just in case you cut yourself which could leave a site for infection.  Also, freshly shaved skin may become irritated if using the CHG soap.  Contact lenses, hearing aids and dentures may not be worn into surgery.  Do not bring valuables to the hospital. [facility] is not responsible for any missing/lost belongings or valuables.    Notify your doctor if there is any change in your medical condition (cold, fever, infection).  Wear comfortable clothing (specific to your surgery type) to the hospital.  After surgery, you can help prevent lung complications by doing breathing exercises.  Take deep breaths and cough every 1-2 hours. Your doctor may order a device called an Incentive Spirometer to help you take deep breaths.  If you are being admitted to the hospital overnight, leave your suitcase in the car. After surgery it may be brought to your room.  If you are being discharged the day of surgery, you will not be allowed to drive home. You will need a responsible adult (18 years or older) to drive you home and stay with you that night.   If you are taking public transportation, you will need to have a responsible adult (18 years or older) with you. Please confirm with your physician that it is acceptable to use public transportation.   Please call the [facility] Dept. at [phone]  if you have any questions about these instructions.  Surgery Visitation Policy:  Patients undergoing a surgery or procedure may have two family members or support persons with them as long as the person is not COVID-19 positive or experiencing its symptoms.   Inpatient Visitation:    Visiting hours are 7 a.m. to 8 p.m. Up to four visitors are allowed at one time in a patient room. The visitors may rotate out with other people  during the day. One designated support person (adult) may remain overnight.  MASKING: Due to an increase in RSV rates and hospitalizations, starting Wednesday, Nov. 15, in patient care areas in which we serve newborns, infants and children, masks will be required for teammates and visitors.  Children ages 4 and under may not visit. This policy affects the following departments only:  [place] Postpartum area Mother Baby Unit Newborn nursery/Special care nursery  Other areas: Masks continue to be strongly recommended for [place] teammates, visitors and patients in all other areas. Visitation is not restricted outside of the units listed above.

## 2022-04-12 ENCOUNTER — Encounter: Payer: Self-pay | Admitting: Urgent Care

## 2022-04-12 ENCOUNTER — Encounter
Admission: RE | Admit: 2022-04-12 | Discharge: 2022-04-12 | Disposition: A | Discharge status: Home / Self Care | Payer: Medicaid Other | Source: Ambulatory Visit | Attending: General Surgery | Admitting: General Surgery

## 2022-04-12 DIAGNOSIS — Z01810 Encounter for preprocedural cardiovascular examination: Secondary | ICD-10-CM | POA: Insufficient documentation

## 2022-04-12 DIAGNOSIS — I82612 Acute embolism and thrombosis of superficial veins of left upper extremity: Secondary | ICD-10-CM | POA: Diagnosis not present

## 2022-04-17 ENCOUNTER — Ambulatory Visit
Admission: RE | Admit: 2022-04-17 | Discharge: 2022-04-17 | Disposition: A | Discharge status: Home / Self Care | Payer: Medicaid Other | Source: Ambulatory Visit | Attending: General Surgery | Admitting: General Surgery

## 2022-04-17 DIAGNOSIS — N6091 Unspecified benign mammary dysplasia of right breast: Secondary | ICD-10-CM

## 2022-04-17 MED ORDER — LIDOCAINE HCL (PF) 1 % IJ SOLN
5.0000 mL | Freq: Once | INTRAMUSCULAR | Status: AC
  Administered 2022-04-17: 5 mL

## 2022-04-18 ENCOUNTER — Encounter
Admission: RE | Disposition: A | Discharge status: Home / Self Care | Payer: Self-pay | Source: Home / Self Care | Attending: General Surgery

## 2022-04-18 ENCOUNTER — Encounter: Payer: Self-pay | Admitting: General Surgery

## 2022-04-18 ENCOUNTER — Ambulatory Visit
Admission: RE | Admit: 2022-04-18 | Discharge: 2022-04-18 | Disposition: A | Discharge status: Home / Self Care | Payer: Medicaid Other | Source: Ambulatory Visit | Attending: General Surgery | Admitting: General Surgery

## 2022-04-18 ENCOUNTER — Other Ambulatory Visit: Payer: Self-pay

## 2022-04-18 ENCOUNTER — Ambulatory Visit
Admission: RE | Admit: 2022-04-18 | Discharge: 2022-04-18 | Disposition: A | Discharge status: Home / Self Care | Payer: Medicaid Other | Attending: General Surgery | Admitting: General Surgery

## 2022-04-18 ENCOUNTER — Ambulatory Visit: Payer: Medicaid Other | Admitting: Certified Registered"

## 2022-04-18 DIAGNOSIS — Z6838 Body mass index (BMI) 38.0-38.9, adult: Secondary | ICD-10-CM | POA: Insufficient documentation

## 2022-04-18 DIAGNOSIS — R7303 Prediabetes: Secondary | ICD-10-CM

## 2022-04-18 DIAGNOSIS — F32A Depression, unspecified: Secondary | ICD-10-CM | POA: Insufficient documentation

## 2022-04-18 DIAGNOSIS — E669 Obesity, unspecified: Secondary | ICD-10-CM | POA: Insufficient documentation

## 2022-04-18 DIAGNOSIS — N6091 Unspecified benign mammary dysplasia of right breast: Secondary | ICD-10-CM | POA: Insufficient documentation

## 2022-04-18 DIAGNOSIS — I82612 Acute embolism and thrombosis of superficial veins of left upper extremity: Secondary | ICD-10-CM

## 2022-04-18 DIAGNOSIS — I10 Essential (primary) hypertension: Secondary | ICD-10-CM | POA: Diagnosis not present

## 2022-04-18 DIAGNOSIS — N6311 Unspecified lump in the right breast, upper outer quadrant: Secondary | ICD-10-CM | POA: Insufficient documentation

## 2022-04-18 DIAGNOSIS — Z803 Family history of malignant neoplasm of breast: Secondary | ICD-10-CM | POA: Diagnosis not present

## 2022-04-18 HISTORY — DX: Acute embolism and thrombosis of unspecified vein: I82.90

## 2022-04-18 HISTORY — PX: BREAST BIOPSY WITH RADIO FREQUENCY LOCALIZER: SHX6895

## 2022-04-18 HISTORY — PX: BREAST LUMPECTOMY: SHX2

## 2022-04-18 SURGERY — BREAST BIOPSY WITH RADIO FREQUENCY LOCALIZER
Anesthesia: General | Laterality: Right

## 2022-04-18 MED ORDER — OXYCODONE HCL 5 MG/5ML PO SOLN: 5.0000 mg | Freq: Once | ORAL | Status: DC | PRN

## 2022-04-18 MED ORDER — LIDOCAINE HCL (PF) 2 % IJ SOLN
INTRAMUSCULAR | Status: AC
  Filled 2022-04-18: qty 5

## 2022-04-18 MED ORDER — PHENYLEPHRINE 80 MCG/ML (10ML) SYRINGE FOR IV PUSH (FOR BLOOD PRESSURE SUPPORT)
PREFILLED_SYRINGE | INTRAVENOUS | Status: DC | PRN
  Administered 2022-04-18 (×5): 40 ug via INTRAVENOUS

## 2022-04-18 MED ORDER — CHLORHEXIDINE GLUCONATE 0.12 % MT SOLN
OROMUCOSAL | Status: AC
  Administered 2022-04-18: 15 mL via OROMUCOSAL
  Filled 2022-04-18: qty 15

## 2022-04-18 MED ORDER — LACTATED RINGERS IV SOLN: INTRAVENOUS | Status: DC | PRN

## 2022-04-18 MED ORDER — DEXMEDETOMIDINE HCL IN NACL 80 MCG/20ML IV SOLN
INTRAVENOUS | Status: AC
  Filled 2022-04-18: qty 20

## 2022-04-18 MED ORDER — STERILE WATER FOR IRRIGATION IR SOLN
Status: DC | PRN
  Administered 2022-04-18: 500 mL

## 2022-04-18 MED ORDER — LIDOCAINE HCL (CARDIAC) PF 100 MG/5ML IV SOSY
PREFILLED_SYRINGE | INTRAVENOUS | Status: DC | PRN
  Administered 2022-04-18: 100 mg via INTRAVENOUS

## 2022-04-18 MED ORDER — FENTANYL CITRATE (PF) 100 MCG/2ML IJ SOLN
INTRAMUSCULAR | Status: DC | PRN
  Administered 2022-04-18: 50 ug via INTRAVENOUS
  Administered 2022-04-18 (×2): 25 ug via INTRAVENOUS

## 2022-04-18 MED ORDER — MIDAZOLAM HCL 2 MG/2ML IJ SOLN
INTRAMUSCULAR | Status: AC
  Filled 2022-04-18: qty 2

## 2022-04-18 MED ORDER — CEFAZOLIN SODIUM-DEXTROSE 2-4 GM/100ML-% IV SOLN
INTRAVENOUS | Status: AC
  Filled 2022-04-18: qty 100

## 2022-04-18 MED ORDER — MIDAZOLAM HCL 2 MG/2ML IJ SOLN
INTRAMUSCULAR | Status: DC | PRN
  Administered 2022-04-18 (×2): 1 mg via INTRAVENOUS

## 2022-04-18 MED ORDER — FENTANYL CITRATE (PF) 100 MCG/2ML IJ SOLN
INTRAMUSCULAR | Status: AC
  Filled 2022-04-18: qty 2

## 2022-04-18 MED ORDER — PROPOFOL 500 MG/50ML IV EMUL
INTRAVENOUS | Status: DC | PRN
  Administered 2022-04-18: 25 ug/kg/min via INTRAVENOUS

## 2022-04-18 MED ORDER — LACTATED RINGERS IV SOLN: INTRAVENOUS | Status: DC

## 2022-04-18 MED ORDER — FAMOTIDINE 20 MG PO TABS
ORAL_TABLET | ORAL | Status: AC
  Administered 2022-04-18: 20 mg via ORAL
  Filled 2022-04-18: qty 1

## 2022-04-18 MED ORDER — ONDANSETRON HCL 4 MG/2ML IJ SOLN
INTRAMUSCULAR | Status: DC | PRN
  Administered 2022-04-18: 4 mg via INTRAVENOUS

## 2022-04-18 MED ORDER — PROPOFOL 10 MG/ML IV BOLUS
INTRAVENOUS | Status: AC
  Filled 2022-04-18: qty 40

## 2022-04-18 MED ORDER — ORAL CARE MOUTH RINSE: 15.0000 mL | Freq: Once | OROMUCOSAL | Status: AC

## 2022-04-18 MED ORDER — CHLORHEXIDINE GLUCONATE 0.12 % MT SOLN: 15.0000 mL | Freq: Once | OROMUCOSAL | Status: AC

## 2022-04-18 MED ORDER — FAMOTIDINE 20 MG PO TABS: 20.0000 mg | ORAL_TABLET | Freq: Once | ORAL | Status: AC

## 2022-04-18 MED ORDER — BUPIVACAINE-EPINEPHRINE (PF) 0.5% -1:200000 IJ SOLN
INTRAMUSCULAR | Status: DC | PRN
  Administered 2022-04-18: 10 mL

## 2022-04-18 MED ORDER — BUPIVACAINE-EPINEPHRINE (PF) 0.5% -1:200000 IJ SOLN
INTRAMUSCULAR | Status: AC
  Filled 2022-04-18: qty 30

## 2022-04-18 MED ORDER — EPHEDRINE SULFATE (PRESSORS) 50 MG/ML IJ SOLN
INTRAMUSCULAR | Status: DC | PRN
  Administered 2022-04-18: 10 mg via INTRAVENOUS
  Administered 2022-04-18: 5 mg via INTRAVENOUS

## 2022-04-18 MED ORDER — TRAMADOL HCL 50 MG PO TABS: 50.0000 mg | ORAL_TABLET | Freq: Four times a day (QID) | ORAL | 0 refills | Status: AC | PRN

## 2022-04-18 MED ORDER — PROPOFOL 10 MG/ML IV BOLUS
INTRAVENOUS | Status: DC | PRN
  Administered 2022-04-18: 30 mg via INTRAVENOUS
  Administered 2022-04-18: 150 mg via INTRAVENOUS
  Administered 2022-04-18: 40 mg via INTRAVENOUS

## 2022-04-18 MED ORDER — SUCCINYLCHOLINE CHLORIDE 200 MG/10ML IV SOSY
PREFILLED_SYRINGE | INTRAVENOUS | Status: AC
  Filled 2022-04-18: qty 10

## 2022-04-18 MED ORDER — ONDANSETRON HCL 4 MG/2ML IJ SOLN
INTRAMUSCULAR | Status: AC
  Filled 2022-04-18: qty 2

## 2022-04-18 MED ORDER — DEXMEDETOMIDINE HCL IN NACL 80 MCG/20ML IV SOLN
INTRAVENOUS | Status: DC | PRN
  Administered 2022-04-18: 4 ug via BUCCAL

## 2022-04-18 MED ORDER — ONDANSETRON HCL 4 MG PO TABS: 4.0000 mg | ORAL_TABLET | Freq: Every day | ORAL | 1 refills | Status: AC | PRN

## 2022-04-18 MED ORDER — ROCURONIUM BROMIDE 10 MG/ML (PF) SYRINGE
PREFILLED_SYRINGE | INTRAVENOUS | Status: AC
  Filled 2022-04-18: qty 10

## 2022-04-18 MED ORDER — FENTANYL CITRATE (PF) 100 MCG/2ML IJ SOLN: 25.0000 ug | INTRAMUSCULAR | Status: DC | PRN

## 2022-04-18 MED ORDER — OXYCODONE HCL 5 MG PO TABS: 5.0000 mg | ORAL_TABLET | Freq: Once | ORAL | Status: DC | PRN

## 2022-04-18 MED ORDER — DEXAMETHASONE SODIUM PHOSPHATE 10 MG/ML IJ SOLN
INTRAMUSCULAR | Status: AC
  Filled 2022-04-18: qty 1

## 2022-04-18 MED ORDER — CEFAZOLIN SODIUM-DEXTROSE 2-4 GM/100ML-% IV SOLN
2.0000 g | INTRAVENOUS | Status: AC
  Administered 2022-04-18: 2 g via INTRAVENOUS

## 2022-04-18 MED ORDER — GLYCOPYRROLATE 0.2 MG/ML IJ SOLN
INTRAMUSCULAR | Status: AC
  Filled 2022-04-18: qty 1

## 2022-04-18 MED ORDER — DEXAMETHASONE SODIUM PHOSPHATE 10 MG/ML IJ SOLN
INTRAMUSCULAR | Status: DC | PRN
  Administered 2022-04-18: 10 mg via INTRAVENOUS

## 2022-04-18 MED ORDER — GLYCOPYRROLATE 0.2 MG/ML IJ SOLN
INTRAMUSCULAR | Status: DC | PRN
  Administered 2022-04-18: .1 mg via INTRAVENOUS

## 2022-04-18 SURGICAL SUPPLY — 44 items
BLADE SURG 15 STRL LF DISP TIS (BLADE) ×1 IMPLANT
BLADE SURG 15 STRL SS (BLADE) ×1
CHLORAPREP W/TINT 26 (MISCELLANEOUS) ×1 IMPLANT
CNTNR SPEC 2.5X3XGRAD LEK (MISCELLANEOUS)
CONT SPEC 4OZ STER OR WHT (MISCELLANEOUS)
CONT SPEC 4OZ STRL OR WHT (MISCELLANEOUS)
CONTAINER SPEC 2.5X3XGRAD LEK (MISCELLANEOUS) ×1 IMPLANT
DERMABOND ADVANCED .7 DNX12 (GAUZE/BANDAGES/DRESSINGS) ×1 IMPLANT
DEVICE DUBIN SPECIMEN MAMMOGRA (MISCELLANEOUS) ×1 IMPLANT
DRAPE LAPAROTOMY TRNSV 106X77 (MISCELLANEOUS) ×1 IMPLANT
ELECT CAUTERY BLADE TIP 2.5 (TIP) ×1
ELECT REM PT RETURN 9FT ADLT (ELECTROSURGICAL) ×1
ELECTRODE CAUTERY BLDE TIP 2.5 (TIP) ×1 IMPLANT
ELECTRODE REM PT RTRN 9FT ADLT (ELECTROSURGICAL) ×1 IMPLANT
GAUZE 4X4 16PLY ~~LOC~~+RFID DBL (SPONGE) ×1 IMPLANT
GAUZE SPONGE 4X4 12PLY STRL (GAUZE/BANDAGES/DRESSINGS) IMPLANT
GLOVE BIO SURGEON STRL SZ 6.5 (GLOVE) ×1 IMPLANT
GLOVE BIOGEL PI IND STRL 6.5 (GLOVE) ×1 IMPLANT
GOWN STRL REUS W/ TWL LRG LVL3 (GOWN DISPOSABLE) ×3 IMPLANT
GOWN STRL REUS W/TWL LRG LVL3 (GOWN DISPOSABLE) ×3
KIT MARKER MARGIN INK (KITS) IMPLANT
KIT TURNOVER KIT A (KITS) ×1 IMPLANT
LABEL OR SOLS (LABEL) ×1 IMPLANT
MANIFOLD NEPTUNE II (INSTRUMENTS) ×1 IMPLANT
MARKER MARGIN CORRECT CLIP (MARKER) IMPLANT
NDL HYPO 25X1 1.5 SAFETY (NEEDLE) ×1 IMPLANT
NEEDLE HYPO 25X1 1.5 SAFETY (NEEDLE) ×1 IMPLANT
PACK BASIN MINOR ARMC (MISCELLANEOUS) ×1 IMPLANT
RETRACTOR RING XSMALL (MISCELLANEOUS) IMPLANT
RTRCTR WOUND ALEXIS 13CM XS SH (MISCELLANEOUS) ×1
SET LOCALIZER 20 PROBE US (MISCELLANEOUS) ×1 IMPLANT
SOL PREP POV-IOD 4OZ 10% (MISCELLANEOUS) IMPLANT
SUT MNCRL 4-0 (SUTURE) ×1
SUT MNCRL 4-0 27XMFL (SUTURE) ×1
SUT SILK 2 0 SH (SUTURE) ×1 IMPLANT
SUT VIC AB 3-0 SH 27 (SUTURE) ×1
SUT VIC AB 3-0 SH 27X BRD (SUTURE) ×1 IMPLANT
SUTURE MNCRL 4-0 27XMF (SUTURE) ×1 IMPLANT
SYR 10ML LL (SYRINGE) ×1 IMPLANT
SYR BULB IRRIG 60ML STRL (SYRINGE) ×1 IMPLANT
TRAP FLUID SMOKE EVACUATOR (MISCELLANEOUS) ×1 IMPLANT
TRAP NEPTUNE SPECIMEN COLLECT (MISCELLANEOUS) ×1 IMPLANT
WATER STERILE IRR 1000ML POUR (IV SOLUTION) ×1 IMPLANT
WATER STERILE IRR 500ML POUR (IV SOLUTION) ×1 IMPLANT

## 2022-04-18 NOTE — Interval H&P Note (Signed)
History and Physical Interval Note:  04/18/2022 7:02 AM  Christine Carroll Level  has presented today for surgery, with the diagnosis of N60.91 Atypical ductal hyperplasia of rt breast.  The various methods of treatment have been discussed with the patient and family. After consideration of risks, benefits and other options for treatment, the patient has consented to  Procedure(s): BREAST BIOPSY WITH RADIO FREQUENCY LOCALIZER (Right) as a surgical intervention.  The patient's history has been reviewed, patient examined, no change in status, stable for surgery.  I have reviewed the patient's chart and labs.  Questions were answered to the patient's satisfaction.     Herbert Pun

## 2022-04-18 NOTE — Op Note (Signed)
Preoperative diagnosis: Right breast atypical ductal hyperplasia.  Postoperative diagnosis: Same.   Procedure: Right radiofrequency tag-localized excisional biopsy.                      Anesthesia: GETA  Surgeon: Dr. Windell Moment  Wound Classification: Clean  Indications: Patient is a 61 y.o. female with a nonpalpable right breast mass noted on mammography with core biopsy demonstrating atypical ductal hyperplasia requires radiofrequency tag-localized excisional biopsy to rule out malignancy.   Findings: 1. Specimen mammography shows marker and tag on specimen 2. No other palpable mass or lymph node identified.   Description of procedure: Preoperative radiofrequency tag localization was performed by radiology. The patient was taken to the operating room and placed supine on the operating table, and after general anesthesia the right chest was prepped and draped in the usual sterile fashion. A time-out was completed verifying correct patient, procedure, site, positioning, and implant(s) and/or special equipment prior to beginning this procedure.  By comparing the localization studies and interrogation with Localizer device, the probable trajectory and location of the mass was visualized. A circumareolar skin incision was planned in such a way as to minimize the amount of dissection to reach the mass.  The skin incision was made. Flaps were raised and the location of the tag was confirmed with Localizer device confirmed. A 2-0 silk figure-of-eight stay suture was placed and used for retraction. Dissection was then taken down circumferentially, taking care to include the entire localizing tag and a wide margin of grossly normal tissue. The specimen and entire localizing tag were removed. The specimen was oriented and sent to radiology with the localization studies. Confirmation was received that the entire target lesion had been resected. The wound was irrigated. Hemostasis was checked. The wound  was closed with interrupted sutures of 3-0 Vicryl and a subcuticular suture of Monocryl 3-0. No attempt was made to close the dead space.   Specimen: Right excisional biopsy                      Complications: None  Estimated Blood Loss: 5 mL

## 2022-04-18 NOTE — Discharge Instructions (Addendum)

## 2022-04-18 NOTE — Transfer of Care (Signed)
Immediate Anesthesia Transfer of Care Note  Patient: Christine Carroll  Procedure(s) Performed: BREAST BIOPSY WITH RADIO FREQUENCY LOCALIZER (Right)  Patient Location: PACU  Anesthesia Type:General  Level of Consciousness: awake, alert , oriented, and patient cooperative  Airway & Oxygen Therapy: Patient Spontanous Breathing and Patient connected to face mask oxygen  Post-op Assessment: Report given to RN and Post -op Vital signs reviewed and stable  Post vital signs: Reviewed and stable  Last Vitals:  Vitals Value Taken Time  BP 143/70 04/18/22 0837  Temp 36.6 C 04/18/22 0837  Pulse 84 04/18/22 0839  Resp 13 04/18/22 0839  SpO2 100 % 04/18/22 0839  Vitals shown include unvalidated device data.  Last Pain:  Vitals:   04/18/22 0837  PainSc: Asleep         Complications: No notable events documented.

## 2022-04-18 NOTE — Anesthesia Postprocedure Evaluation (Signed)
Anesthesia Post Note  Patient: Christine Carroll  Procedure(s) Performed: BREAST BIOPSY WITH RADIO FREQUENCY LOCALIZER (Right)  Patient location during evaluation: PACU Anesthesia Type: General Level of consciousness: awake and alert Pain management: pain level controlled Vital Signs Assessment: post-procedure vital signs reviewed and stable Respiratory status: spontaneous breathing, nonlabored ventilation, respiratory function stable and patient connected to nasal cannula oxygen Cardiovascular status: blood pressure returned to baseline and stable Postop Assessment: no apparent nausea or vomiting Anesthetic complications: no  No notable events documented.   Last Vitals:  Vitals:   04/18/22 0915 04/18/22 0927  BP: 134/62 122/68  Pulse: 77 76  Resp: 15 18  Temp:  (!) 36.2 C  SpO2: 96% 93%    Last Pain:  Vitals:   04/18/22 0927  TempSrc: Temporal  PainSc: 0-No pain                 Dimas Millin

## 2022-04-18 NOTE — Anesthesia Procedure Notes (Signed)
Procedure Name: LMA Insertion Date/Time: 04/18/2022 7:34 AM  Performed by: Adalberto Ill, CRNAPre-anesthesia Checklist: Patient identified, Emergency Drugs available, Suction available, Patient being monitored and Timeout performed Patient Re-evaluated:Patient Re-evaluated prior to induction Oxygen Delivery Method: Circle system utilized Preoxygenation: Pre-oxygenation with 100% oxygen Induction Type: IV induction Ventilation: Mask ventilation without difficulty LMA: LMA inserted LMA Size: 4.0 Tube type: Oral Number of attempts: 1 Placement Confirmation: positive ETCO2 and breath sounds checked- equal and bilateral ETT to lip (cm): yes. Tube secured with: Tape Dental Injury: Teeth and Oropharynx as per pre-operative assessment and Injury to lip  Comments: Small 1 mm abrasion to lip during insertion sterile lube applied

## 2022-04-18 NOTE — Anesthesia Preprocedure Evaluation (Addendum)
Anesthesia Evaluation  Patient identified by MRN, date of birth, ID band Patient awake    Reviewed: Allergy & Precautions, NPO status , Patient's Chart, lab work & pertinent test results, reviewed documented beta blocker date and time   History of Anesthesia Complications Negative for: history of anesthetic complications  Airway Mallampati: II  TM Distance: >3 FB Neck ROM: full    Dental  (+) Dental Advidsory Given, Poor Dentition   Pulmonary neg pulmonary ROS, neg shortness of breath, neg COPD   Pulmonary exam normal        Cardiovascular hypertension, Pt. 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 No date: Hypertension No date: LEFT ARM  Thrombosis No date: Migraines No date: Obesity No date: Pre-diabetes No date: Sleep apnea     Comment:  DOES NOT use CPAP as of 04/09/2022  Past Surgical History: No date: ABDOMINAL HYSTERECTOMY 03/13/2022: BREAST BIOPSY; Right     Comment:  [place] Core bx , ribbon clip , path pending No date: cesarean No date: CHOLECYSTECTOMY 10/14/2015: COLONOSCOPY WITH PROPOFOL; N/A     Comment:  Procedure: COLONOSCOPY WITH PROPOFOL;  Surgeon: Manya Silvas, MD;  Location: [facility] END[facility]COPY;  Service:               Endoscopy;  Laterality: N/A; 04/04/2016: LAPAROSCOPIC HYSTERECTOMY; N/A     Comment:  Procedure: HYSTERECTOMY TOTAL LAPAROSCOPIC/ BILATERAL               SALPINGECTOMY;  Surgeon: [name], MD;  Location:                [facility] ORS;  Service: Gynecology;  Laterality: N/A; No date: OOPHORECTOMY     Comment:  one ovary left No date: TUBAL LIGATION  BMI    Body Mass Index: 34.33 kg/m      Reproductive/Obstetrics negative OB ROS                             Anesthesia Physical Anesthesia Plan  ASA: 3  Anesthesia Plan: General   Post-op Pain Management:    Induction: Intravenous  PONV Risk Score and Plan: 3 and Dexamethasone, Ondansetron, Midazolam and Treatment may vary due to age or medical condition  Airway Management Planned: LMA  Additional Equipment:   Intra-op Plan:   Post-operative Plan: Extubation in OR  Informed Consent: I have reviewed the patients History and Physical, chart, labs and discussed the procedure including the risks, benefits and alternatives for the proposed anesthesia with the patient or authorized representative who has indicated his/her understanding and acceptance.     Dental Advisory Given  Plan Discussed with: CRNA  Anesthesia Plan Comments: (Patient consented for risks of anesthesia including but not limited to:  - adverse reactions to medications - damage to eyes, teeth, lips or other oral mucosa - nerve damage due to positioning  - sore throat or hoarseness - Damage to  heart, brain, nerves, lungs, other parts of body or loss of life  Patient voiced understanding.)        Anesthesia Quick Evaluation

## 2022-04-20 LAB — SURGICAL PATHOLOGY

## 2022-05-03 ENCOUNTER — Encounter: Payer: Self-pay | Admitting: Oncology

## 2022-05-03 ENCOUNTER — Inpatient Hospital Stay: Payer: Medicaid Other | Attending: Oncology | Admitting: Oncology

## 2022-05-03 VITALS — BP 140/68 | HR 77 | Temp 98.0°F | Wt 214.6 lb

## 2022-05-03 DIAGNOSIS — Z803 Family history of malignant neoplasm of breast: Secondary | ICD-10-CM | POA: Diagnosis not present

## 2022-05-03 DIAGNOSIS — N6099 Unspecified benign mammary dysplasia of unspecified breast: Secondary | ICD-10-CM

## 2022-05-03 DIAGNOSIS — N6091 Unspecified benign mammary dysplasia of right breast: Secondary | ICD-10-CM | POA: Insufficient documentation

## 2022-05-03 DIAGNOSIS — Z809 Family history of malignant neoplasm, unspecified: Secondary | ICD-10-CM

## 2022-05-03 NOTE — Progress Notes (Signed)
Patient is referred here By [facility] for atypical ductal hyperplasia of the right breast.

## 2022-05-04 DIAGNOSIS — N6099 Unspecified benign mammary dysplasia of unspecified breast: Secondary | ICD-10-CM | POA: Insufficient documentation

## 2022-05-04 DIAGNOSIS — Z809 Family history of malignant neoplasm, unspecified: Secondary | ICD-10-CM | POA: Insufficient documentation

## 2022-05-04 NOTE — Assessment & Plan Note (Signed)
Refer to genetic counselor.  

## 2022-05-04 NOTE — Assessment & Plan Note (Signed)
Pathology was reviewed and discussed with patient.  She has mild atypical ductal hyperplasia on initial biopsy. Atypical ductal hyperplasia is associated with a generalized, bilateral increase in breast cancer risk. Recommendation: Active Surveillance: life time annual screening mammography as well as  history and physical examination every 6 to 12 months Endocrine therapy as chemoprevention.-We discussed about options of tamoxifen versus aromatase inhibitor. Has a history of upper extremity occlusive thrombus.  Family history of antiphospholipid syndrome.  Tamoxifen may not be a good option for her. Discussed with patient about the rationale and potential side effects of aromatase inhibitors.  Recommend baseline bone density if patient decides to proceed with aromatase inhibitor for chemoprevention. Patient is currently undecided.  She will call the office and update her decision.

## 2022-05-04 NOTE — Progress Notes (Signed)
Hematology/Oncology Consult Note [phone] [phone]     REFERRING PROVIDER: Adin Hector, MD   Patient Care Team: Adin Hector, MD as PCP - General (Internal Medicine) [place], Heath Gold, RN  CHIEF COMPLAINTS/PURPOSE OF CONSULTATION:  Atypical ductal hyperplasia  HISTORY OF PRESENTING ILLNESS:  Christine Carroll 61 y.o. female presents to establish care for atypical ductal hyperplasia 02/07/2022, bilateral screening mammogram showed right breast possible mass warrants further evaluation.  No findings suspicious for malignancy in the left breast. 02/28/2022, diagnostic right mammogram plus ultrasound showed indeterminate 13 mm mass at the site of screening mammogram concern.  No suspicious right axillary adenopathy. 03/13/2022, patient underwent right breast lesion biopsy.  Pathology showed atypical fibroepithelial lesion.  The core biopsy shows features of fibroadenoma, the epithelial component shows mild atypia, [in the range that can be seen with mild atypical ductal hyperplasia].  04/18/2022, patient underwent right breast lesion excision by Dr. Peyton Najjar. 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  Not present and inked margins.  No evidence of malignancy.  Patient presents to establish care and discuss chemoprevention. Patient has a family history of maternal cousin with breast cancer. Menarche 61 years of age, first child born at age of 28.  OCP for about 4 years from 66 to 67 years of age, no previous chest radiation, no previous biopsy.  Denies any previous hormone replacement therapy.    MEDICAL HISTORY:  Past Medical History:  Diagnosis Date   Depression    Herpetic whitlow    Hypertension    LEFT ARM  Thrombosis    Migraines    Obesity    Pre-diabetes    Sleep apnea    DOES NOT use CPAP as of 04/09/2022    SURGICAL HISTORY: Past Surgical History:  Procedure Laterality Date   ABDOMINAL  HYSTERECTOMY     BREAST BIOPSY Right 03/13/2022   [place] Core bx , ribbon clip , path pending   BREAST BIOPSY WITH RADIO FREQUENCY LOCALIZER Right 04/18/2022   Procedure: BREAST BIOPSY WITH RADIO FREQUENCY LOCALIZER;  Surgeon: Herbert Pun, MD;  Location: [facility] ORS;  Service: General;  Laterality: Right;   cesarean     CHOLECYSTECTOMY     COLONOSCOPY WITH PROPOFOL N/A 10/14/2015   Procedure: COLONOSCOPY WITH PROPOFOL;  Surgeon: Manya Silvas, MD;  Location: [place];  Service: Endoscopy;  Laterality: N/A;   LAPAROSCOPIC HYSTERECTOMY N/A 04/04/2016   Procedure: HYSTERECTOMY TOTAL LAPAROSCOPIC/ BILATERAL SALPINGECTOMY;  Surgeon: [name], MD;  Location: [facility] ORS;  Service: Gynecology;  Laterality: N/A;   OOPHORECTOMY     one ovary left   TUBAL LIGATION      SOCIAL HISTORY: Social History   Socioeconomic History   Marital status: Divorced    Spouse name: Not on file   Number of children: 2   Years of education: Not on file   Highest education level: Associate degree: occupational, Hotel manager, or vocational program  Occupational History   Not on file  Tobacco Use   Smoking status: Never   Smokeless tobacco: Never  Vaping Use   Vaping Use: Never used  Substance and Sexual Activity   Alcohol use: No   Drug use: No   Sexual activity: Not Currently    Birth control/protection: Post-menopausal, Surgical  Other Topics Concern   Not on file  Social History Narrative   Not on file   Social Determinants of Health   Financial Resource Strain: Not on file  Food Insecurity: No Food  Insecurity (02/06/2022)   Hunger Vital Sign    Worried About Running Out of Food in the Last Year: Never true    Ran Out of Food in the Last Year: Never true  Transportation Needs: No Transportation Needs (02/06/2022)   PRAPARE - Hydrologist (Medical): No    Lack of Transportation (Non-Medical): No  Physical Activity: Not on file  Stress: Not on file   Social Connections: Not on file  Intimate Partner Violence: Not on file    FAMILY HISTORY: Family History  Problem Relation Age of Onset   Breast cancer Maternal Aunt    Hypertension Mother    Stroke Mother    CAD Father    Deep vein thrombosis Father    Pulmonary embolism Sister    Deep vein thrombosis Other     ALLERGIES:  is allergic to vicodin [hydrocodone-acetaminophen], wellbutrin [bupropion], and chlorhexidine.  MEDICATIONS:  Current Outpatient Medications  Medication Sig Dispense Refill   acetaminophen (TYLENOL) 500 MG tablet Take 1,000 mg by mouth every 8 (eight) hours as needed.     aspirin EC 81 MG tablet Take 81 mg by mouth daily. Swallow whole.     cetirizine (ZYRTEC) 10 MG chewable tablet Chew 10 mg by mouth daily.     citalopram (CELEXA) 40 MG tablet Take 40 mg by mouth daily.     diphenhydrAMINE (BENADRYL) 25 mg capsule Take 25 mg by mouth every 6 (six) hours as needed for sleep.     ergocalciferol (VITAMIN D2) 50000 units capsule Take 50,000 Units by mouth once a week.     hydrochlorothiazide (MICROZIDE) 12.5 MG capsule Take 12.5 mg by mouth daily.     Melatonin 10 MG CAPS Take 1 capsule by mouth at bedtime.     nystatin (MYCOSTATIN/NYSTOP) powder Apply 1 Application topically as needed.     ondansetron (ZOFRAN) 4 MG tablet Take 1 tablet (4 mg total) by mouth daily as needed for nausea or vomiting. 30 tablet 1   senna (SENOKOT) 8.6 MG TABS tablet Take 2-3 tablets by mouth at bedtime.     traMADol (ULTRAM) 50 MG tablet Take 1 tablet (50 mg total) by mouth every 6 (six) hours as needed. 20 tablet 0   Biotin 10000 MCG TABS Take 1 tablet by mouth daily. (Patient not taking: Reported on 04/09/2022)     Black Cohosh 20 MG TABS Take 20 mg by mouth. (Patient not taking: Reported on 04/09/2022)     Calcium Carb-Cholecalciferol (CALCIUM 600 + D) 600-200 MG-UNIT TABS Take 1 tablet by mouth daily. (Patient not taking: Reported on 04/09/2022)     Cranberry 400 MG CAPS Take  400 capsules by mouth. (Patient not taking: Reported on 70/01/6282)     Garlic 662 MG TABS Take 1 tablet by mouth daily. (Patient not taking: Reported on 04/09/2022)     lidocaine (LIDODERM) 5 %  (Patient not taking: Reported on 04/09/2022)     oxycodone (OXY-IR) 5 MG capsule Take 1 capsule (5 mg total) by mouth every 6 (six) hours as needed for pain. (Patient not taking: Reported on 05/11/2016) 10 capsule 0   predniSONE (DELTASONE) 10 MG tablet 10 day taper. 5,5,4,4,3,3,2,2,1,1 (Patient not taking: Reported on 04/18/2022) 30 tablet 0   vitamin B-12 (CYANOCOBALAMIN) 1000 MCG tablet Take 1,000 mcg by mouth daily. (Patient not taking: Reported on 04/09/2022)     No current facility-administered medications for this visit.    Review of Systems  Constitutional:  Negative for appetite  change, chills, fatigue and fever.  HENT:   Negative for hearing loss and voice change.   Eyes:  Negative for eye problems.  Respiratory:  Negative for chest tightness and cough.   Cardiovascular:  Negative for chest pain.  Gastrointestinal:  Negative for abdominal distention, abdominal pain and blood in stool.  Endocrine: Negative for hot flashes.  Genitourinary:  Negative for difficulty urinating and frequency.   Musculoskeletal:  Negative for arthralgias.  Skin:  Negative for itching and rash.  Neurological:  Negative for extremity weakness.  Hematological:  Negative for adenopathy.  Psychiatric/Behavioral:  Negative for confusion.      PHYSICAL EXAMINATION: ECOG PERFORMANCE STATUS: 0 - Asymptomatic  Vitals:   05/03/22 1456  BP: (!) 140/68  Pulse: 77  Temp: 98 F (36.7 C)  SpO2: 97%   Filed Weights   05/03/22 1456  Weight: 214 lb 9.6 oz (97.3 kg)    Physical Exam Constitutional:      General: She is not in acute distress.    Appearance: She is not diaphoretic.  HENT:     Head: Normocephalic and atraumatic.     Mouth/Throat:     Pharynx: No oropharyngeal exudate.  Eyes:     General: No  scleral icterus. Cardiovascular:     Rate and Rhythm: Normal rate.  Pulmonary:     Effort: Pulmonary effort is normal. No respiratory distress.  Abdominal:     General: There is no distension.     Palpations: Abdomen is soft.  Musculoskeletal:        General: Normal range of motion.     Cervical back: Normal range of motion.  Skin:    Findings: No erythema.  Neurological:     Mental Status: She is alert and oriented to person, place, and time. Mental status is at baseline.     Motor: No abnormal muscle tone.     Coordination: Coordination normal.  Psychiatric:        Mood and Affect: Mood and affect normal.      LABORATORY DATA:  I have reviewed the data as listed    Latest Ref Rng & Units 08/10/2019   10:29 PM 04/18/2016    6:02 AM 04/17/2016    9:59 AM  CBC  WBC 4.0 - 10.5 K/uL 13.7  15.7  21.5   Hemoglobin 12.0 - 15.0 g/dL 13.1  11.8  12.1   Hematocrit 36.0 - 46.0 % 39.9  34.5  36.3   Platelets 150 - 400 K/uL 359  377  422       Latest Ref Rng & Units 08/10/2019   10:29 PM 04/18/2016    6:02 AM 04/17/2016    9:59 AM  CMP  Glucose 70 - 99 mg/dL 100  99  107   BUN 6 - 20 mg/dL _0 Creatinine 0.44 - 1.00 mg/dL 0.82  0.71  0.83   Sodium 135 - 145 mmol/L 143  142  137   Potassium 3.5 - 5.1 mmol/L 3.7  3.8  3.8   Chloride 98 - 111 mmol/L 106  109  104   CO2 22 - 32 mmol/L _1 Calcium 8.9 - 10.3 mg/dL 9.1  8.4  8.9   Total Protein 6.5 - 8.1 g/dL 7.0  6.4    Total Bilirubin 0.3 - 1.2 mg/dL 0.5  0.8    Alkaline Phos 38 - 126 U/L 73  79    AST 15 - 41  U/L 19  46    ALT 0 - 44 U/L 17  48       RADIOGRAPHIC STUDIES: I have personally reviewed the radiological images as listed and agreed with the findings in the report. MM Breast Surgical Specimen  Result Date: 04/18/2022 CLINICAL DATA:  Status post radiofrequency ID localized right breast lumpectomy. EXAM: SPECIMEN RADIOGRAPH OF THE RIGHT BREAST COMPARISON:  Previous exam(s). FINDINGS: Status post  excision of the right breast. The RF ID tag and ribbon shaped clip are present within the specimen. IMPRESSION: Specimen radiograph of the right breast. Electronically Signed   By: Beryle Flock M.D.   On: 04/18/2022 14:23  MM DIAG BREAST TOMO UNI RIGHT  Result Date: 04/17/2022 CLINICAL DATA:  Status post RF ID tag placement. Patient is status post ultrasound-guided biopsy of a RIGHT breast mass (RIBBON clip) which demonstrated atypical fibroepithelial lesion. EXAM: DIAGNOSTIC RIGHT MAMMOGRAM POST ULTRASOUND-GUIDED RADIOFREQUENCY ID TAG PLACEMENT COMPARISON:  Previous exam(s). FINDINGS: Mammographic images were obtained following ultrasound-guided radiofrequency ID tag placement. These demonstrate radiofrequency ID tag [id number]) in association with the mass in the RIGHT upper outer breast and immediately adjacent to the RIBBON clip. IMPRESSION: Appropriate location of the radiofrequency ID tag. Final Assessment: Post Procedure Mammograms for radiofrequency ID tag placement Electronically Signed   By: Valentino Saxon M.D.   On: 04/17/2022 16:54  [place] RT RADIO FREQUENCY TAG LOC US GUIDE  Result Date: 04/17/2022 CLINICAL DATA:  Patient is status post ultrasound-guided biopsy which demonstrated atypical fibroepithelial lesion (RIBBON clip). EXAM: NEEDLE LOCALIZATION OF THE RIGHT BREAST WITH ULTRASOUND GUIDANCE COMPARISON:  Previous exam(s). FINDINGS: Patient presents for needle localization prior to excisional biopsy. I met with the patient and we discussed the procedure of needle localization including benefits and alternatives. We discussed the high likelihood of a successful procedure. We discussed the risks of the procedure, including infection, bleeding, tissue injury, and further surgery. Informed, written consent was given. The usual time-out protocol was performed immediately prior to the procedure. Using ultrasound guidance, sterile technique, 1% lidocaine and a 5 cm radiofrequency ID tag needle  (#[id number]), the mass at 10 o'clock 9 cm from the nipple was localized using an inferolateral approach. RF ID tag function was confirmed with an auditory signal. The images were marked for Cintron-Diaz. IMPRESSION: Radiofrequency ID tag localization of the RIGHT breast. No apparent complications. Electronically Signed   By: Valentino Saxon M.D.   On: 04/17/2022 16:43   ASSESSMENT & PLAN:  Atypical ductal hyperplasia of breast Pathology was reviewed and discussed with patient.  She has mild atypical ductal hyperplasia on initial biopsy. Atypical ductal hyperplasia is associated with a generalized, bilateral increase in breast cancer risk. Recommendation: Active Surveillance: life time annual screening mammography as well as  history and physical examination every 6 to 12 months Endocrine therapy as chemoprevention.-We discussed about options of tamoxifen versus aromatase inhibitor. Has a history of upper extremity occlusive thrombus.  Family history of antiphospholipid syndrome.  Tamoxifen may not be a good option for her. Discussed with patient about the rationale and potential side effects of aromatase inhibitors.  Recommend baseline bone density if patient decides to proceed with aromatase inhibitor for chemoprevention. Patient is currently undecided.  She will call the office and update her decision.   Family history of cancer Refer to genetic counselor.   Orders Placed This Encounter  Procedures   Ambulatory referral to Genetics    Referral Priority:   Routine    Referral Type:   Consultation  Referral Reason:   Specialty Services Required    Number of Visits Requested:   1    Follow-up to be determined.  Depending on patient's decision.   All questions were answered. The patient knows to call the clinic with any problems, questions or concerns. No barriers to learning was detected.  Earlie Server, MD 05/03/2022

## 2022-06-13 ENCOUNTER — Encounter: Payer: Self-pay | Admitting: Licensed Clinical Social Worker

## 2022-06-13 ENCOUNTER — Inpatient Hospital Stay: Payer: Medicaid Other | Attending: Oncology | Admitting: Licensed Clinical Social Worker

## 2022-06-13 ENCOUNTER — Inpatient Hospital Stay: Payer: Medicaid Other

## 2022-06-13 DIAGNOSIS — Z803 Family history of malignant neoplasm of breast: Secondary | ICD-10-CM | POA: Diagnosis not present

## 2022-06-13 DIAGNOSIS — N6099 Unspecified benign mammary dysplasia of unspecified breast: Secondary | ICD-10-CM | POA: Diagnosis not present

## 2022-06-13 NOTE — Progress Notes (Signed)
REFERRING PROVIDER: Earlie Server, MD [place],  [place] [address]  PRIMARY PROVIDER:  Adin Hector, MD  PRIMARY REASON FOR VISIT:  1. Atypical ductal hyperplasia of breast   2. Family history of breast cancer      HISTORY OF PRESENT ILLNESS:   Ms. Buchanan, a 62 y.o. female, was seen for a [place] cancer genetics consultation at the request of Dr. Tasia Catchings due to a personal history of ADH and family history of cancer.  Ms. Wilton presents to clinic today to discuss the possibility of a hereditary predisposition to cancer, genetic testing, and to further clarify her future cancer risks, as well as potential cancer risks for family members.    CANCER HISTORY:  Ms. Abadi is a 62 y.o. female with no personal history of cancer.    RISK FACTORS:  Menarche was at age 49.  First live birth at age 49.  OCP use for approximately 4 years.  Ovaries intact: has 1 ovary.  Hysterectomy: yes.  Menopausal status: postmenopausal.  HRT use: 0 years. Colonoscopy: yes;  2017 .- 3 polyps Mammogram within the last year: yes. Number of breast biopsies: 2.  Past Medical History:  Diagnosis Date   Depression    Herpetic whitlow    Hypertension    LEFT ARM  Thrombosis    Migraines    Obesity    Pre-diabetes    Sleep apnea    DOES NOT use CPAP as of 04/09/2022    Past Surgical History:  Procedure Laterality Date   ABDOMINAL HYSTERECTOMY     BREAST BIOPSY Right 03/13/2022   [place] Core bx , ribbon clip , path pending   BREAST BIOPSY WITH RADIO FREQUENCY LOCALIZER Right 04/18/2022   Procedure: BREAST BIOPSY WITH RADIO FREQUENCY LOCALIZER;  Surgeon: Herbert Pun, MD;  Location: [facility];  Service: General;  Laterality: Right;   cesarean     CHOLECYSTECTOMY     COLONOSCOPY WITH PROPOFOL N/A 10/14/2015   Procedure: COLONOSCOPY WITH PROPOFOL;  Surgeon: Manya Silvas, MD;  Location: [facility];  Service: Endoscopy;  Laterality: N/A;   LAPAROSCOPIC HYSTERECTOMY N/A  04/04/2016   Procedure: HYSTERECTOMY TOTAL LAPAROSCOPIC/ BILATERAL SALPINGECTOMY;  Surgeon: [name], MD;  Location: [facility];  Service: Gynecology;  Laterality: N/A;   OOPHORECTOMY     one ovary left   TUBAL LIGATION      FAMILY HISTORY:  We obtained a detailed, 4-generation family history.  Significant diagnoses are listed below: Family History  Problem Relation Age of Onset   Hypertension Mother    Stroke Mother    CAD Father    Deep vein thrombosis Father    Pulmonary embolism Sister    Deep vein thrombosis Other    Breast cancer Cousin 102 - 41   Breast cancer Cousin 52 - 19   Ms. Haslip had 4 sisters and 1 brother. Her brother passed away and possibly had lung cancer. Ms. Belcourt also has 1 son, 1, and 1 daughter, 61.   Ms. Vandenbosch mother is living at 26. Patient has 11 maternal aunts/uncles, no known cancers. Maternal first cousin had breast cancer in her 36s. A maternal first cousin once removed had breast cancer in her 4s. Maternal grandfather passed in his 34s, grandmother passed in her 68s of a stroke.   Ms. Wiegman father died at 69. No known cancers on his side of the family.  Ms. Nordling is unaware of previous family history of genetic testing for hereditary cancer risks.  There is no reported Ashkenazi Jewish ancestry. There is no known consanguinity.    GENETIC COUNSELING ASSESSMENT: Ms. Chismar is a 62 y.o. female with a family history of breast cancer which is not particularly suggestive of a hereditary cancer syndrome and predisposition to cancer. We, therefore, discussed and recommended the following at today's visit.   DISCUSSION: We discussed that approximately 10% of breast cancer is hereditary. Most cases of hereditary breast cancer are associated with BRCA1/BRCA2 genes, although there are other genes associated with hereditary cancer as well. Cancers and risks are gene specific. We discussed that testing is beneficial for several reasons including  knowing about cancer risks, identifying potential screening and risk-reduction options that may be appropriate, and to understand if other family members could be at risk for cancer and allow them to undergo genetic testing.   We reviewed the characteristics, features and inheritance patterns of hereditary cancer syndromes. We also discussed genetic testing, including the appropriate family members to test, the process of testing, insurance coverage and turn-around-time for results. We discussed the implications of a negative, positive and/or variant of uncertain significant result. We discussed that Ms. Ponzo's family is not particularly concerning for a hereditary cancer condition. While Ms. Buckner does not meet medical criteria for testing, she could pursue testing through the [facility] Common Hereditary Cancers panel if she is still interested.    Based on Ms. Yum family history of cancer, she does not meet medical criteria for genetic testing. She may have an out of pocket cost.   PLAN: After considering the risks, benefits, and limitations, Ms. Hehr provided informed consent to pursue genetic testing and the blood sample was sent to [facility] for analysis of the [facility] Common Hereditary Cancers+RNA panel. Results should be available within approximately 2-3 weeks' time, at which point they will be disclosed by telephone to Ms. Volden, as will any additional recommendations warranted by these results. Ms. Kossman will receive a summary of her genetic counseling visit and a copy of her results once available. This information will also be available in Epic.   Ms. Holstein questions were answered to her satisfaction today. Our contact information was provided should additional questions or concerns arise. Thank you for the referral and allowing [place] to share in the care of your patient.   Faith Rogue, MS, [facility] Genetic Counselor [email or web address] Phone:  [phone]  The patient was seen for a total of 20 minutes in face-to-face genetic counseling.  Dr. Grayland Ormond was available for discussion regarding this case.   _______________________________________________________________________ For Office Staff:  Number of people involved in session: 1 Was an Intern/ student involved with case: no

## 2022-06-26 ENCOUNTER — Ambulatory Visit: Payer: Self-pay | Admitting: Licensed Clinical Social Worker

## 2022-06-26 ENCOUNTER — Encounter: Payer: Self-pay | Admitting: Licensed Clinical Social Worker

## 2022-06-26 ENCOUNTER — Telehealth: Payer: Self-pay | Admitting: Licensed Clinical Social Worker

## 2022-06-26 DIAGNOSIS — Z1379 Encounter for other screening for genetic and chromosomal anomalies: Secondary | ICD-10-CM | POA: Insufficient documentation

## 2022-06-26 NOTE — Telephone Encounter (Signed)
I contacted Ms. Paterson to discuss her genetic testing results. No pathogenic variants were identified in the 48 genes analyzed. Detailed clinic note to follow.   The test report has been scanned into EPIC and is located under the Molecular Pathology section of the Results Review tab.  A portion of the result report is included below for reference.      Faith Rogue, MS, [facility] Genetic Counselor [email or web address] Phone: [phone]

## 2022-06-26 NOTE — Progress Notes (Signed)
HPI:   Christine Carroll was previously seen in the [place] clinic due to a family history of breast cancer and concerns regarding a hereditary predisposition to cancer. Please refer to our prior cancer genetics clinic note for more information regarding our discussion, assessment and recommendations, at the time. Christine Carroll recent genetic test results were disclosed to her, as were recommendations warranted by these results. These results and recommendations are discussed in more detail below.  CANCER HISTORY:  Oncology History   No history exists.    FAMILY HISTORY:  We obtained a detailed, 4-generation family history.  Significant diagnoses are listed below: Family History  Problem Relation Age of Onset   Hypertension Mother    Stroke Mother    CAD Father    Deep vein thrombosis Father    Pulmonary embolism Sister    Deep vein thrombosis Other    Breast cancer Cousin 75 - 18   Breast cancer Cousin 65 - 25   Christine Carroll had 4 sisters and 1 brother. Her brother passed away and possibly had lung cancer. Christine Carroll also has 1 son, 14, and 1 daughter, 56.    Christine Carroll mother Carroll living at 81. Patient has 11 maternal aunts/uncles, no known cancers. Maternal first cousin had breast cancer in her 13s. A maternal first cousin once removed had breast cancer in her 45s. Maternal grandfather passed in his 10s, grandmother passed in her 85s of a stroke.    Christine Carroll father died at 36. No known cancers on his side of the family.   Christine Carroll Carroll unaware of previous family history of genetic testing for hereditary cancer risks. There Carroll no reported Ashkenazi Jewish ancestry. There Carroll no known consanguinity.      GENETIC TEST RESULTS:  The [facility] Common Hereditary Cancers+RNA Panel found no pathogenic mutations.   The Common Hereditary Cancers Panel + RNA offered by [facility] includes sequencing and/or deletion duplication testing of the following 48 genes: APC*, ATM*, AXIN2,  BAP1, BARD1, BMPR1A, BRCA1, BRCA2, BRIP1, CDH1, CDK4, CDKN2A (p14ARF), CDKN2A (p16INK4a), CHEK2, CTNNA1, DICER1*, EPCAM*, FH*, GREM1*, HOXB13, KIT, MBD4, MEN1*, MLH1*, MSH2*, MSH3*, MSH6*, MUTYH, NF1*, NTHL1, PALB2, PDGFRA, PMS2*, POLD1*, POLE, PTEN*, RAD51C, RAD51D, SDHA*, SDHB, SDHC*, SDHD, SMAD4, SMARCA4, STK11, TP53, TSC1*, TSC2, VHL.   The test report has been scanned into EPIC and Carroll located under the Molecular Pathology section of the Results Review tab.  A portion of the result report Carroll included below for reference. Genetic testing reported out on 06/25/2022.     Even though a pathogenic variant was not identified, possible explanations for the cancer in the family may include: There may be no hereditary risk for cancer in the family. The cancers in Christine Carroll and/or her family may be sporadic/familial or due to other genetic and environmental factors. There may be a gene mutation in one of these genes that current testing methods cannot detect but that chance Carroll small. There could be another gene that has not yet been discovered, or that we have not yet tested, that Carroll responsible for the cancer diagnoses in the family.  It Carroll also possible there Carroll a hereditary cause for the cancer in the family that Christine Carroll did not inherit.  Therefore, it Carroll important to remain in touch with cancer genetics in the future so that we can continue to offer Christine Carroll the most up to date genetic testing.   ADDITIONAL GENETIC TESTING:  We discussed with Christine Carroll that  her genetic testing was fairly extensive.  If there are additional relevant genes identified to increase cancer risk that can be analyzed in the future, we would be happy to discuss and coordinate this testing at that time.    CANCER SCREENING RECOMMENDATIONS:  Christine Carroll test result Carroll considered negative (normal).  This means that we have not identified a hereditary cause for her family history of cancer at this time.   An individual's  cancer risk and medical management are not determined by genetic test results alone. Overall cancer risk assessment incorporates additional factors, including personal medical history, family history, and any available genetic information that may result in a personalized plan for cancer prevention and surveillance. Therefore, it Carroll recommended she continue to follow the cancer management and screening guidelines provided by her oncology and primary healthcare provider.  Based on the reported personal and family history, specific cancer screenings for Christine Carroll and her family include:  Breast Cancer Screening:  The Carroll model Carroll one of multiple prediction models developed to estimate an individual's lifetime risk of developing breast cancer. The Carroll model Carroll endorsed by the [employer]  (NCCN). This model includes many risk factors such as family history, endogenous estrogen exposure, and benign breast disease. The calculation Carroll highly-dependent on the accuracy of clinical data provided by the patient and can change over time. The Carroll model may be repeated to reflect new information in her personal or family history in the future.      Christine Carroll risk score Carroll 26.5%.  For women with a greater than 20% lifetime risk of breast cancer, the NCCN recommends the following:    1.   Clinical encounter every 6-12 months to begin when identified as being at increased risk, but not before age 72    2.   Annual mammograms, tomosynthesis Carroll recommended starting 10 years earlier than the youngest breast cancer diagnosis in the family or at age 93 (whichever comes first), but not before age 26     3.   Annual breast MRI starting 10 years earlier than the youngest breast cancer diagnosis in the family or at age 13 (whichever comes first), but not before age 61   Christine Carroll followed by Dr. Tasia Catchings for her high risk for breast cancer.    RECOMMENDATIONS FOR FAMILY MEMBERS:   Since she did not inherit a identifiable mutation in a cancer predisposition gene included on this panel, her children could not have inherited a known mutation from her in one of these genes. Individuals in this family might be at some increased risk of developing cancer, over the general population risk, due to the family history of cancer.  Individuals in the family should notify their providers of the family history of cancer. We recommend women in this family have a yearly mammogram beginning at age 25, or 79 years younger than the earliest onset of cancer, an annual clinical breast exam, and perform monthly breast self-exams.  Family members should have colonoscopies by at age 53, or earlier, as recommended by their providers.  FOLLOW-UP:  Lastly, we discussed with Ms. Beaudoin that cancer genetics Carroll a rapidly advancing field and it Carroll possible that new genetic tests will be appropriate for her and/or her family members in the future. We encouraged her to remain in contact with cancer genetics on an annual basis so we can update her personal and family histories and let her know of advances in cancer genetics  that may benefit this family.   Our contact number was provided. Ms. Rouillard questions were answered to her satisfaction, and she knows she Carroll welcome to call us at anytime with additional questions or concerns.    Faith Rogue, MS, [facility] Genetic Counselor [email or web address] Phone: [phone]

## 2022-07-04 ENCOUNTER — Ambulatory Visit (LOCAL_COMMUNITY_HEALTH_CENTER): Payer: Medicaid Other

## 2022-07-04 ENCOUNTER — Ambulatory Visit: Payer: Medicaid Other

## 2022-07-04 DIAGNOSIS — Z719 Counseling, unspecified: Secondary | ICD-10-CM

## 2022-07-04 DIAGNOSIS — Z23 Encounter for immunization: Secondary | ICD-10-CM | POA: Diagnosis not present

## 2022-07-04 NOTE — Progress Notes (Signed)
  Are you feeling sick today? No   Have you ever received a dose of COVID-19 Vaccine? [employer], [place], [place], [place], Other) Yes  If yes, which vaccine and how many doses?  [place], 2 Moderna   Did you bring the vaccination record card or other documentation?  Yes   Do you have a health condition or are undergoing treatment that makes you moderately or severely immunocompromised? This would include, but not be limited to: cancer, HIV, organ transplant, immunosuppressive therapy/high-dose corticosteroids, or moderate/severe primary immunodeficiency.  No  Have you received COVID-19 vaccine before or during hematopoietic cell transplant (HCT) or CAR-T-cell therapies? No  Have you ever had an allergic reaction to: (This would include a severe allergic reaction or a reaction that caused hives, swelling, or respiratory distress, including wheezing.) A component of a COVID-19 vaccine or a previous dose of COVID-19 vaccine? No   Have you ever had an allergic reaction to another vaccine (other thanCOVID-19 vaccine) or an injectable medication? (This would include a severe allergic reaction or a reaction that caused hives, swelling, or respiratory distress, including wheezing.)   No    Do you have a history of any of the following:  Myocarditis or Pericarditis No  Dermal fillers:  No  Multisystem Inflammatory Syndrome (MIS-C or MIS-A)? No  COVID-19 disease within the past 3 months? No  Vaccinated with monkeypox vaccine in the last 4 weeks? No  Eligible, administered [place] 12y+, yr 2023-2024. Pt did not stay for 15 min observation post covid vaccination. Provided VIS, [facility]IR and covid 19 record card. M.China Deitrick, LPN.

## 2022-07-26 ENCOUNTER — Other Ambulatory Visit: Payer: Self-pay

## 2022-07-26 ENCOUNTER — Telehealth: Payer: Self-pay

## 2022-07-26 DIAGNOSIS — Z86010 Personal history of colonic polyps: Secondary | ICD-10-CM

## 2022-07-26 MED ORDER — NA SULFATE-K SULFATE-MG SULF 17.5-3.13-1.6 GM/177ML PO SOLN: 1.0000 | Freq: Once | ORAL | 0 refills | Status: AC

## 2022-07-26 NOTE — Addendum Note (Signed)
Addended by: Vanetta Mulders on: 07/26/2022 04:03 PM   Modules accepted: Orders

## 2022-07-26 NOTE — Telephone Encounter (Signed)
Patient returned your call to schedule colonoscopy.

## 2022-07-26 NOTE — Telephone Encounter (Signed)
Gastroenterology Pre-Procedure Review  Request Date: 08/21/22 Requesting Physician: Dr. Vicente Males  PATIENT REVIEW QUESTIONS: The patient responded to the following health history questions as indicated:    1. Are you having any GI issues? no 2. Do you have a personal history of Polyps? yes (10/14/2015 performed by Dr. Vira Agar) 3. Do you have a family history of Colon Cancer or Polyps? no 4. Diabetes Mellitus? no 5. Joint replacements in the past 12 months? no 6. Major health problems in the past 3 months?no 7. Any artificial heart valves, MVP, or defibrillator?no    MEDICATIONS & ALLERGIES:    Patient reports the following regarding taking any anticoagulation/antiplatelet therapy:   Plavix, Coumadin, Eliquis, Xarelto, Lovenox, Pradaxa, Brilinta, or Effient? no Aspirin? yes (81 mg aspirin)  Patient confirms/reports the following medications:  Current Outpatient Medications  Medication Sig Dispense Refill   acetaminophen (TYLENOL) 500 MG tablet Take 1,000 mg by mouth every 8 (eight) hours as needed.     aspirin EC 81 MG tablet Take 81 mg by mouth daily. Swallow whole.     Biotin 10000 MCG TABS Take 1 tablet by mouth daily. (Patient not taking: Reported on 04/09/2022)     Black Cohosh 20 MG TABS Take 20 mg by mouth. (Patient not taking: Reported on 04/09/2022)     Calcium Carb-Cholecalciferol (CALCIUM 600 + D) 600-200 MG-UNIT TABS Take 1 tablet by mouth daily. (Patient not taking: Reported on 04/09/2022)     cetirizine (ZYRTEC) 10 MG chewable tablet Chew 10 mg by mouth daily.     citalopram (CELEXA) 40 MG tablet Take 40 mg by mouth daily.     Cranberry 400 MG CAPS Take 400 capsules by mouth. (Patient not taking: Reported on 05/03/2022)     diphenhydrAMINE (BENADRYL) 25 mg capsule Take 25 mg by mouth every 6 (six) hours as needed for sleep.     ergocalciferol (VITAMIN D2) 50000 units capsule Take 50,000 Units by mouth once a week.     Garlic [id number] MG TABS Take 1 tablet by mouth daily. (Patient  not taking: Reported on 04/09/2022)     hydrochlorothiazide (MICROZIDE) 12.5 MG capsule Take 12.5 mg by mouth daily.     lidocaine (LIDODERM) 5 %  (Patient not taking: Reported on 04/09/2022)     Melatonin 10 MG CAPS Take 1 capsule by mouth at bedtime.     nystatin (MYCOSTATIN/NYSTOP) powder Apply 1 Application topically as needed.     ondansetron (ZOFRAN) 4 MG tablet Take 1 tablet (4 mg total) by mouth daily as needed for nausea or vomiting. 30 tablet 1   oxycodone (OXY-IR) 5 MG capsule Take 1 capsule (5 mg total) by mouth every 6 (six) hours as needed for pain. (Patient not taking: Reported on 05/11/2016) 10 capsule 0   predniSONE (DELTASONE) 10 MG tablet 10 day taper. 5,5,4,4,3,3,2,2,1,1 (Patient not taking: Reported on 04/18/2022) 30 tablet 0   senna (SENOKOT) 8.6 MG TABS tablet Take 2-3 tablets by mouth at bedtime.     traMADol (ULTRAM) 50 MG tablet Take 1 tablet (50 mg total) by mouth every 6 (six) hours as needed. 20 tablet 0   vitamin B-12 (CYANOCOBALAMIN) 1000 MCG tablet Take 1,000 mcg by mouth daily. (Patient not taking: Reported on 04/09/2022)     No current facility-administered medications for this visit.    Patient confirms/reports the following allergies:  Allergies  Allergen Reactions   Vicodin [Hydrocodone-Acetaminophen] Nausea And Vomiting   Wellbutrin [Bupropion] Hives   Chlorhexidine Itching and Rash    No  orders of the defined types were placed in this encounter.   AUTHORIZATION INFORMATION Primary Insurance: 1D#: Group #:  Secondary Insurance: 1D#: Group #:  SCHEDULE INFORMATION: Date:  Time: Location: [facility]

## 2022-08-03 ENCOUNTER — Encounter: Payer: Self-pay | Admitting: Family Medicine

## 2022-08-21 ENCOUNTER — Ambulatory Visit: Payer: Medicaid Other | Admitting: Certified Registered Nurse Anesthetist

## 2022-08-21 ENCOUNTER — Other Ambulatory Visit: Payer: Self-pay

## 2022-08-21 ENCOUNTER — Ambulatory Visit
Admission: RE | Admit: 2022-08-21 | Discharge: 2022-08-21 | Disposition: A | Discharge status: Home / Self Care | Payer: Medicaid Other | Attending: Gastroenterology | Admitting: Gastroenterology

## 2022-08-21 ENCOUNTER — Encounter
Admission: RE | Disposition: A | Discharge status: Home / Self Care | Payer: Self-pay | Source: Home / Self Care | Attending: Gastroenterology

## 2022-08-21 DIAGNOSIS — I10 Essential (primary) hypertension: Secondary | ICD-10-CM | POA: Diagnosis not present

## 2022-08-21 DIAGNOSIS — D126 Benign neoplasm of colon, unspecified: Secondary | ICD-10-CM | POA: Diagnosis not present

## 2022-08-21 DIAGNOSIS — K621 Rectal polyp: Secondary | ICD-10-CM | POA: Insufficient documentation

## 2022-08-21 DIAGNOSIS — G4730 Sleep apnea, unspecified: Secondary | ICD-10-CM | POA: Insufficient documentation

## 2022-08-21 DIAGNOSIS — Z86010 Personal history of colonic polyps: Secondary | ICD-10-CM | POA: Diagnosis not present

## 2022-08-21 DIAGNOSIS — Z79899 Other long term (current) drug therapy: Secondary | ICD-10-CM | POA: Diagnosis not present

## 2022-08-21 DIAGNOSIS — Z1211 Encounter for screening for malignant neoplasm of colon: Secondary | ICD-10-CM | POA: Insufficient documentation

## 2022-08-21 DIAGNOSIS — F32A Depression, unspecified: Secondary | ICD-10-CM | POA: Diagnosis not present

## 2022-08-21 DIAGNOSIS — Z8249 Family history of ischemic heart disease and other diseases of the circulatory system: Secondary | ICD-10-CM | POA: Insufficient documentation

## 2022-08-21 HISTORY — PX: COLONOSCOPY WITH PROPOFOL: SHX5780

## 2022-08-21 SURGERY — COLONOSCOPY WITH PROPOFOL
Anesthesia: General

## 2022-08-21 MED ORDER — PROPOFOL 10 MG/ML IV BOLUS
INTRAVENOUS | Status: DC | PRN
  Administered 2022-08-21: 70 mg via INTRAVENOUS
  Administered 2022-08-21: 20 mg via INTRAVENOUS

## 2022-08-21 MED ORDER — SODIUM CHLORIDE 0.9 % IV SOLN: INTRAVENOUS | Status: DC

## 2022-08-21 MED ORDER — LIDOCAINE HCL (PF) 2 % IJ SOLN
INTRAMUSCULAR | Status: AC
  Filled 2022-08-21: qty 5

## 2022-08-21 MED ORDER — PROPOFOL 1000 MG/100ML IV EMUL
INTRAVENOUS | Status: AC
  Filled 2022-08-21: qty 100

## 2022-08-21 MED ORDER — LIDOCAINE HCL (CARDIAC) PF 100 MG/5ML IV SOSY
PREFILLED_SYRINGE | INTRAVENOUS | Status: DC | PRN
  Administered 2022-08-21: 50 mg via INTRAVENOUS

## 2022-08-21 MED ORDER — PROPOFOL 500 MG/50ML IV EMUL
INTRAVENOUS | Status: DC | PRN
  Administered 2022-08-21: 150 ug/kg/min via INTRAVENOUS

## 2022-08-21 NOTE — Op Note (Signed)
[facility] Gastroenterology Patient Name: Christine Carroll Procedure Date: 08/21/2022 8:28 AM MRN: [id number] Account #: [id number] Date of Birth: [birth year] Admit Type: Outpatient Age: 62 Room: [facility] ENDO ROOM 2 Gender: Female Note Status: Finalized Instrument Name: Jasper Riling [id number] Procedure:             Colonoscopy Indications:           Surveillance: Personal history of colonic polyps                         (unknown histology) on last colonoscopy 5 years ago Providers:             Jonathon Bellows MD, MD Referring MD:          Ramonita Lab, MD (Referring MD) Medicines:             Monitored Anesthesia Care Complications:         No immediate complications. Procedure:             Pre-Anesthesia Assessment:                        - Prior to the procedure, a History and Physical was                         performed, and patient medications, allergies and                         sensitivities were reviewed. The patient's tolerance                         of previous anesthesia was reviewed.                        - The risks and benefits of the procedure and the                         sedation options and risks were discussed with the                         patient. All questions were answered and informed                         consent was obtained.                        - ASA Grade Assessment: II - A patient with mild                         systemic disease.                        After obtaining informed consent, the colonoscope was                         passed under direct vision. Throughout the procedure,                         the patient's blood pressure, pulse, and oxygen  saturations were monitored continuously. The                         Colonoscope was introduced through the anus and                         advanced to the the cecum, identified by the                         appendiceal orifice. The colonoscopy was performed                          with ease. The patient tolerated the procedure well.                         The quality of the bowel preparation was excellent.                         The ileocecal valve, appendiceal orifice, and rectum                         were photographed. Findings:      Two sessile polyps were found in the ascending colon. The polyps were 4       to 6 mm in size. These polyps were removed with a cold snare. Resection       and retrieval were complete.      A 3 mm polyp was found in the rectum. The polyp was sessile. The polyp       was removed with a cold biopsy forceps. Resection and retrieval were       complete.      The exam was otherwise without abnormality on direct and retroflexion       views. Impression:            - Two 4 to 6 mm polyps in the ascending colon, removed                         with a cold snare. Resected and retrieved.                        - One 3 mm polyp in the rectum, removed with a cold                         biopsy forceps. Resected and retrieved.                        - The examination was otherwise normal on direct and                         retroflexion views. Recommendation:        - Discharge patient to home (with escort).                        - Resume previous diet.                        - Continue present medications.                        -  Await pathology results.                        - Repeat colonoscopy in 3 years for surveillance based                         on pathology results. Procedure Code(s):     --- Professional ---                        [phone], Colonoscopy, flexible; with removal of                         tumor(s), polyp(s), or other lesion(s) by snare                         technique                        45380, 55, Colonoscopy, flexible; with biopsy, single                         or multiple Diagnosis Code(s):     --- Professional ---                        Z86.010, Personal history of colonic polyps                         D12.2, Benign neoplasm of ascending colon                        D12.8, Benign neoplasm of rectum CPT copyright 2022 American Medical Association. All rights reserved. The codes documented in this report are preliminary and upon coder review may  be revised to meet current compliance requirements. Jonathon Bellows, MD Jonathon Bellows MD, MD 08/21/2022 8:51:44 AM This report has been signed electronically. Number of Addenda: 0 Note Initiated On: 08/21/2022 8:28 AM Scope Withdrawal Time: 0 hours 11 minutes 28 seconds  Total Procedure Duration: 0 hours 14 minutes 45 seconds  Estimated Blood Loss:  Estimated blood loss: none.      [facility]

## 2022-08-21 NOTE — Anesthesia Procedure Notes (Signed)
Procedure Name: MAC Date/Time: 08/21/2022 7:52 AM  Performed by: Tollie Eth, CRNAPre-anesthesia Checklist: Patient identified, Emergency Drugs available, Suction available and Patient being monitored Patient Re-evaluated:Patient Re-evaluated prior to induction Oxygen Delivery Method: Nasal cannula Induction Type: IV induction Placement Confirmation: positive ETCO2

## 2022-08-21 NOTE — Anesthesia Preprocedure Evaluation (Addendum)
Anesthesia Evaluation  Patient identified by MRN, date of birth, ID band Patient awake    Reviewed: Allergy & Precautions, NPO status , Patient's Chart, lab work & pertinent test results, reviewed documented beta blocker date and time   History of Anesthesia Complications Negative for: history of anesthetic complications  Airway Mallampati: II  TM Distance: >3 FB Neck ROM: full    Dental  (+) Dental Advidsory Given, Poor Dentition   Pulmonary neg shortness of breath, sleep apnea , neg COPD   Pulmonary exam normal        Cardiovascular hypertension, Pt. 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 No date: Hypertension No date: LEFT ARM  Thrombosis No date: Migraines No date: Obesity No date: Pre-diabetes No date: Sleep apnea     Comment:  DOES NOT use CPAP as of 04/09/2022  Past Surgical History: No date: ABDOMINAL HYSTERECTOMY 03/13/2022: BREAST BIOPSY; Right     Comment:  [place] Core bx , ribbon clip , path pending No date: cesarean No date: CHOLECYSTECTOMY 10/14/2015: COLONOSCOPY WITH PROPOFOL; N/A     Comment:  Procedure: COLONOSCOPY WITH PROPOFOL;  Surgeon: Manya Silvas, MD;  Location: [facility] ENDOSCOPY;  Service:               Endoscopy;  Laterality: N/A; 04/04/2016: LAPAROSCOPIC HYSTERECTOMY; N/A     Comment:  Procedure: HYSTERECTOMY TOTAL LAPAROSCOPIC/ BILATERAL               SALPINGECTOMY;  Surgeon: [name], MD;  Location:               [facility] ORS;  Service: Gynecology;  Laterality:  N/A; No date: OOPHORECTOMY     Comment:  one ovary left No date: TUBAL LIGATION  BMI    Body Mass Index: 34.33 kg/m      Reproductive/Obstetrics negative OB ROS                             Anesthesia Physical Anesthesia Plan  ASA: 2  Anesthesia Plan: General   Post-op Pain Management:    Induction: Intravenous  PONV Risk Score and Plan: 3 and Treatment may vary due to age or medical condition, TIVA and Propofol infusion  Airway Management Planned: Natural Airway  Additional Equipment:   Intra-op Plan:   Post-operative Plan:   Informed Consent: I have reviewed the patients History and Physical, chart, labs and discussed the procedure including the risks, benefits and alternatives for the proposed anesthesia with the patient or authorized representative who has indicated his/her understanding and acceptance.     Dental Advisory Given  Plan Discussed with: CRNA  Anesthesia Plan Comments:         Anesthesia Quick Evaluation

## 2022-08-21 NOTE — H&P (Signed)
Jonathon Bellows, MD [address], [place], [place], [place], [address] [address], [place], [place], [place], [address] Phone: [phone]  Fax: [phone]  Primary Care Physician:  Adin Hector, MD   Pre-Procedure History & Physical: HPI:  Christine Carroll is a 62 y.o. female is here for an colonoscopy.   Past Medical History:  Diagnosis Date   Depression    Herpetic whitlow    Hypertension    LEFT ARM  Thrombosis    Migraines    Obesity    Pre-diabetes    Sleep apnea    DOES NOT use CPAP as of 04/09/2022    Past Surgical History:  Procedure Laterality Date   ABDOMINAL HYSTERECTOMY     BREAST BIOPSY Right 03/13/2022   [place] Core bx , ribbon clip , path pending   BREAST BIOPSY WITH RADIO FREQUENCY LOCALIZER Right 04/18/2022   Procedure: BREAST BIOPSY WITH RADIO FREQUENCY LOCALIZER;  Surgeon: Herbert Pun, MD;  Location: [facility] ORS;  Service: General;  Laterality: Right;   cesarean     CHOLECYSTECTOMY     COLONOSCOPY WITH PROPOFOL N/A 10/14/2015   Procedure: COLONOSCOPY WITH PROPOFOL;  Surgeon: Manya Silvas, MD;  Location: [place];  Service: Endoscopy;  Laterality: N/A;   LAPAROSCOPIC HYSTERECTOMY N/A 04/04/2016   Procedure: HYSTERECTOMY TOTAL LAPAROSCOPIC/ BILATERAL SALPINGECTOMY;  Surgeon: [name], MD;  Location: [facility] ORS;  Service: Gynecology;  Laterality: N/A;   OOPHORECTOMY     one ovary left   TUBAL LIGATION      Prior to Admission medications   Medication Sig Start Date End Date Taking? Authorizing Provider  citalopram (CELEXA) 40 MG tablet Take 40 mg by mouth daily.   Yes [provider]  hydrochlorothiazide (MICROZIDE) 12.5 MG capsule Take 12.5 mg by mouth daily.   Yes [provider]  acetaminophen (TYLENOL) 500 MG tablet Take 1,000 mg by mouth every 8 (eight) hours as needed.    [provider]  aspirin EC 81 MG tablet Take 81 mg by mouth daily. Swallow whole.    [provider]  Biotin  10000 MCG TABS Take 1 tablet by mouth daily. Patient not taking: Reported on 04/09/2022    [provider]  Black Cohosh 20 MG TABS Take 20 mg by mouth. Patient not taking: Reported on 04/09/2022    [provider]  Calcium Carb-Cholecalciferol (CALCIUM 600 + D) 600-200 MG-UNIT TABS Take 1 tablet by mouth daily. Patient not taking: Reported on 04/09/2022    [provider]  cetirizine (ZYRTEC) 10 MG chewable tablet Chew 10 mg by mouth daily.    [provider]  Cranberry 400 MG CAPS Take 400 capsules by mouth. Patient not taking: Reported on 05/03/2022    [provider]  diphenhydrAMINE (BENADRYL) 25 mg capsule Take 25 mg by mouth every 6 (six) hours as needed for sleep.    [provider]  ergocalciferol (VITAMIN D2) 50000 units capsule Take 50,000 Units by mouth once a week.    [provider]  Garlic [id number] MG TABS Take 1 tablet by mouth daily. Patient not taking: Reported on 04/09/2022    [provider]  lidocaine (LIDODERM) 5 %  07/25/16   [provider]  Melatonin 10 MG CAPS Take 1 capsule by mouth at bedtime.    [provider]  nystatin (MYCOSTATIN/NYSTOP) powder Apply 1 Application topically as needed.    [provider]  ondansetron (ZOFRAN) 4 MG tablet Take 1 tablet (4 mg  total) by mouth daily as needed for nausea or vomiting. 04/18/22 04/18/23  Herbert Pun, MD  oxycodone (OXY-IR) 5 MG capsule Take 1 capsule (5 mg total) by mouth every 6 (six) hours as needed for pain. Patient not taking: Reported on 05/11/2016 04/04/16   [name], MD  predniSONE (DELTASONE) 10 MG tablet 10 day taper. 5,5,4,4,3,3,2,2,1,1 Patient not taking: Reported on 04/18/2022 08/21/19   Duanne Guess, PA-C  senna (SENOKOT) 8.6 MG TABS tablet Take 2-3 tablets by mouth at bedtime.    [provider]  traMADol (ULTRAM) 50 MG tablet Take 1 tablet (50 mg total) by mouth every 6 (six) hours as  needed. 04/18/22 04/18/23  Herbert Pun, MD  vitamin B-12 (CYANOCOBALAMIN) 1000 MCG tablet Take 1,000 mcg by mouth daily. Patient not taking: Reported on 04/09/2022    [provider]    Allergies as of 07/27/2022 - Review Complete 07/26/2022  Allergen Reaction Noted   Vicodin [hydrocodone-acetaminophen] Nausea And Vomiting 10/13/2015   Wellbutrin [bupropion] Hives 08/10/2019   Chlorhexidine Itching and Rash 04/18/2022    Family History  Problem Relation Age of Onset   Hypertension Mother    Stroke Mother    CAD Father    Deep vein thrombosis Father    Pulmonary embolism Sister    Deep vein thrombosis Other    Breast cancer Cousin 31 - 60   Breast cancer Cousin 51 - 89    Social History   Socioeconomic History   Marital status: Divorced    Spouse name: Not on file   Number of children: 2   Years of education: Not on file   Highest education level: Associate degree: occupational, Hotel manager, or vocational program  Occupational History   Not on file  Tobacco Use   Smoking status: Never   Smokeless tobacco: Never  Vaping Use   Vaping Use: Never used  Substance and Sexual Activity   Alcohol use: No   Drug use: No   Sexual activity: Not Currently    Birth control/protection: Post-menopausal, Surgical  Other Topics Concern   Not on file  Social History Narrative   Not on file   Social Determinants of Health   Financial Resource Strain: Not on file  Food Insecurity: No Food Insecurity (02/06/2022)   Hunger Vital Sign    Worried About Running Out of Food in the Last Year: Never true    Ran Out of Food in the Last Year: Never true  Transportation Needs: No Transportation Needs (02/06/2022)   PRAPARE - Hydrologist (Medical): No    Lack of Transportation (Non-Medical): No  Physical Activity: Not on file  Stress: Not on file  Social Connections: Not on file  Intimate Partner Violence: Not on file    Review of  Systems: See HPI, otherwise negative ROS  Physical Exam: BP 128/70   Pulse 70   Temp (!) 96.7 F (35.9 C) (Temporal)   Resp 20   Ht 5\' 4"  (1.626 m)   Wt 94.1 kg   SpO2 95%   BMI 35.60 kg/m  General:   Alert,  pleasant and cooperative in NAD Head:  Normocephalic and atraumatic. Neck:  Supple; no masses or thyromegaly. Lungs:  Clear throughout to auscultation, normal respiratory effort.    Heart:  +S1, +S2, Regular rate and rhythm, No edema. Abdomen:  Soft, nontender and nondistended. Normal bowel sounds, without guarding, and without rebound.   Neurologic:  Alert and  oriented x4;  grossly normal neurologically.  Impression/Plan: Christine Carroll is here for an colonoscopy to be performed for surveillance due to prior history of colon polyps   Risks, benefits, limitations, and alternatives regarding  colonoscopy have been reviewed with the patient.  Questions have been answered.  All parties agreeable.   Jonathon Bellows, MD  08/21/2022, 8:27 AM

## 2022-08-21 NOTE — Transfer of Care (Signed)
Immediate Anesthesia Transfer of Care Note  Patient: Christine Carroll  Procedure(s) Performed: COLONOSCOPY WITH PROPOFOL  Patient Location: Endoscopy Unit  Anesthesia Type:General  Level of Consciousness: awake, alert , and oriented  Airway & Oxygen Therapy: Patient Spontanous Breathing  Post-op Assessment: Report given to RN and Post -op Vital signs reviewed and stable  Post vital signs: Reviewed and stable  Last Vitals:  Vitals Value Taken Time  BP 103/62 08/21/22 0853  Temp 36.3 C 08/21/22 0853  Pulse 69 08/21/22 0853  Resp 17 08/21/22 0853  SpO2 98 % 08/21/22 0853  Vitals shown include unvalidated device data.  Last Pain:  Vitals:   08/21/22 0853  TempSrc: Temporal  PainSc: (P) Asleep         Complications: No notable events documented.

## 2022-08-21 NOTE — Anesthesia Postprocedure Evaluation (Signed)
Anesthesia Post Note  Patient: Christine Carroll  Procedure(s) Performed: COLONOSCOPY WITH PROPOFOL  Patient location during evaluation: Endoscopy Anesthesia Type: General Level of consciousness: awake and alert Pain management: pain level controlled Vital Signs Assessment: post-procedure vital signs reviewed and stable Respiratory status: spontaneous breathing, nonlabored ventilation and respiratory function stable Cardiovascular status: blood pressure returned to baseline and stable Postop Assessment: no apparent nausea or vomiting Anesthetic complications: no   No notable events documented.   Last Vitals:  Vitals:   08/21/22 0903 08/21/22 0913  BP: 115/62 113/65  Pulse: 69 61  Resp: 14 19  Temp:    SpO2: 100% 91%    Last Pain:  Vitals:   08/21/22 0913  TempSrc:   PainSc: 0-No pain                 [place] Ouch

## 2022-08-22 LAB — SURGICAL PATHOLOGY

## 2022-08-23 ENCOUNTER — Encounter: Payer: Self-pay | Admitting: Gastroenterology

## 2022-12-20 ENCOUNTER — Other Ambulatory Visit: Payer: Self-pay | Admitting: General Surgery

## 2022-12-20 DIAGNOSIS — Z1231 Encounter for screening mammogram for malignant neoplasm of breast: Secondary | ICD-10-CM

## 2023-02-11 ENCOUNTER — Ambulatory Visit
Admission: RE | Admit: 2023-02-11 | Discharge: 2023-02-11 | Disposition: A | Discharge status: Home / Self Care | Payer: Medicaid Other | Source: Ambulatory Visit | Attending: General Surgery | Admitting: General Surgery

## 2023-02-11 DIAGNOSIS — Z1231 Encounter for screening mammogram for malignant neoplasm of breast: Secondary | ICD-10-CM | POA: Diagnosis present

## 2023-02-12 ENCOUNTER — Inpatient Hospital Stay: Admission: RE | Admit: 2023-02-12 | Payer: Medicaid Other | Source: Ambulatory Visit

## 2023-04-17 ENCOUNTER — Ambulatory Visit
Admission: RE | Admit: 2023-04-17 | Discharge: 2023-04-17 | Disposition: A | Discharge status: Home / Self Care | Payer: Medicaid Other | Source: Ambulatory Visit | Attending: Physician Assistant | Admitting: Physician Assistant

## 2023-04-17 ENCOUNTER — Other Ambulatory Visit: Payer: Self-pay | Admitting: Physician Assistant

## 2023-04-17 DIAGNOSIS — R519 Headache, unspecified: Secondary | ICD-10-CM

## 2023-04-17 DIAGNOSIS — R42 Dizziness and giddiness: Secondary | ICD-10-CM

## 2023-04-19 ENCOUNTER — Ambulatory Visit
Admission: RE | Admit: 2023-04-19 | Discharge: 2023-04-19 | Disposition: A | Discharge status: Home / Self Care | Payer: Medicaid Other | Source: Ambulatory Visit | Attending: Physician Assistant | Admitting: Physician Assistant

## 2023-04-19 DIAGNOSIS — R42 Dizziness and giddiness: Secondary | ICD-10-CM | POA: Diagnosis present

## 2023-04-19 DIAGNOSIS — R519 Headache, unspecified: Secondary | ICD-10-CM | POA: Insufficient documentation

## 2023-08-01 ENCOUNTER — Ambulatory Visit: Payer: Medicaid Other | Admitting: Dermatology

## 2023-11-27 ENCOUNTER — Other Ambulatory Visit: Payer: Self-pay

## 2023-11-27 ENCOUNTER — Encounter: Payer: Self-pay | Admitting: Emergency Medicine

## 2023-11-27 ENCOUNTER — Emergency Department
Admission: EM | Admit: 2023-11-27 | Discharge: 2023-11-27 | Disposition: A | Discharge status: Home / Self Care | Attending: Emergency Medicine | Admitting: Emergency Medicine

## 2023-11-27 DIAGNOSIS — T782XXA Anaphylactic shock, unspecified, initial encounter: Secondary | ICD-10-CM | POA: Insufficient documentation

## 2023-11-27 DIAGNOSIS — I10 Essential (primary) hypertension: Secondary | ICD-10-CM | POA: Insufficient documentation

## 2023-11-27 DIAGNOSIS — T7840XA Allergy, unspecified, initial encounter: Secondary | ICD-10-CM | POA: Diagnosis present

## 2023-11-27 MED ORDER — EPINEPHRINE 0.3 MG/0.3ML IJ SOAJ
0.3000 mg | Freq: Once | INTRAMUSCULAR | Status: AC
  Administered 2023-11-27: 0.3 mg via INTRAMUSCULAR
  Filled 2023-11-27: qty 0.3

## 2023-11-27 MED ORDER — FAMOTIDINE 20 MG PO TABS
40.0000 mg | ORAL_TABLET | Freq: Once | ORAL | Status: AC
  Administered 2023-11-27: 40 mg via ORAL
  Filled 2023-11-27: qty 2

## 2023-11-27 MED ORDER — PREDNISONE 20 MG PO TABS
60.0000 mg | ORAL_TABLET | Freq: Once | ORAL | Status: AC
  Administered 2023-11-27: 60 mg via ORAL
  Filled 2023-11-27: qty 3

## 2023-11-27 MED ORDER — PREDNISONE 20 MG PO TABS: 40.0000 mg | ORAL_TABLET | Freq: Every day | ORAL | 0 refills | Status: AC

## 2023-11-27 MED ORDER — EPINEPHRINE 0.3 MG/0.3ML IJ SOAJ: 0.3000 mg | INTRAMUSCULAR | 3 refills | Status: AC | PRN

## 2023-11-27 NOTE — ED Triage Notes (Signed)
 Patient to ED via POV for allergic reaction. States she was stung by a yellow jacket on her left ring finger. Rash all over with facial swelling. Took 100mg  of Benadryl PTA. Denies breathing difficulties or tongue swelling.

## 2023-11-27 NOTE — ED Notes (Signed)
 Swelling around lips decreased. Hives on legs and arms improved. Pt denies difficulty breathing.

## 2023-11-27 NOTE — ED Notes (Signed)
 Pt noted to not have any hives, SHOB and she states swelling has improved. NO airway compromise noted.

## 2023-11-27 NOTE — ED Provider Notes (Addendum)
 [facility] Provider Note    Event Date/Time   First MD Initiated Contact with Patient 11/27/23 1742     (approximate)   History   Chief Complaint: Allergic Reaction   HPI  Christine Carroll is a 63 y.o. female with a past history of hypertension, obesity who was in her usual state of health, got stung on the left ring finger by a yellowjacket today.  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  Rapidly developed generalized urticaria and is now developing some edema of the left lower lip.  Denies trouble breathing or swallowing or talking.  No vomiting.  No shortness of breath or wheezing        Past Medical History:  Diagnosis Date   Depression    Herpetic whitlow    Hypertension    LEFT ARM  Thrombosis    Migraines    Obesity    Pre-diabetes    Sleep apnea    DOES NOT use CPAP as of 04/09/2022    Current Outpatient Rx   Order #: [id number] Class: Normal   Order #: [id number] Class: Normal   Order #: [id number] Class: Historical Med   Order #: [id number] Class: Historical Med   Order #: [id number] Class: Historical Med   Order #: [id number] Class: Historical Med   Order #: [id number] Class: Historical Med   Order #: [id number] Class: Historical Med   Order #: [id number] Class: Historical Med   Order #: [id number] Class: Historical Med   Order #: [id number] Class: Historical Med   Order #: [id number] Class: Historical Med   Order #: [id number] Class: Historical Med   Order #: [id number] Class: Historical Med   Order #: [id number] Class: Historical Med   Order #: [id number] Class: Historical Med   Order #: [id number] Class: Historical Med   Order #: [id number] Class: Print   Order #: [id number] Class: Historical Med   Order #: [id number] Class: Historical Med    Past Surgical History:  Procedure Laterality Date   ABDOMINAL HYSTERECTOMY     BREAST BIOPSY Right 03/13/2022   US  Core bx , ribbon clip , 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  - NOT PRESENT AND  INKED MARGINS (0.8 CENTIMETERS CLOSEST APPROACH TO  LATERAL MARGIN)  - BIOPSY SITE CHANGES SEEN.  - NO EVIDENCE OF MALIGNANCY.   BREAST BIOPSY WITH RADIO FREQUENCY LOCALIZER Right 04/18/2022   Procedure: BREAST BIOPSY WITH RADIO FREQUENCY LOCALIZER;  Surgeon: Rodolph Romano, MD;  Location: [facility] ORS;  Service: General;  Laterality: Right;   BREAST LUMPECTOMY Right 04/18/2022   neg surg bx   cesarean     CHOLECYSTECTOMY     COLONOSCOPY WITH PROPOFOL  N/A 10/14/2015   Procedure: COLONOSCOPY WITH PROPOFOL ;  Surgeon: Lamar ONEIDA Holmes, MD;  Location: [facility] ENDOSCOPY;  Service: Endoscopy;  Laterality: N/A;   COLONOSCOPY WITH PROPOFOL  N/A 08/21/2022   Procedure: COLONOSCOPY WITH PROPOFOL ;  Surgeon: Therisa Bi, MD;  Location: [facility] ENDOSCOPY;  Service: Gastroenterology;  Laterality: N/A;   LAPAROSCOPIC HYSTERECTOMY N/A 04/04/2016   Procedure: HYSTERECTOMY TOTAL LAPAROSCOPIC/ BILATERAL SALPINGECTOMY;  Surgeon: Heather Penton, MD;  Location: [facility] ORS;  Service: Gynecology;  Laterality: N/A;   OOPHORECTOMY     one ovary left   TUBAL LIGATION      Physical Exam   Triage Vital Signs: ED Triage Vitals [11/27/23 1716]  Encounter Vitals Group     BP 130/78     Girls Systolic BP Percentile      Girls Diastolic BP Percentile      Boys Systolic BP Percentile      Boys Diastolic BP  Percentile      Pulse Rate 87     Resp 17     Temp 98.4 F (36.9 C)     Temp Source Oral     SpO2 96 %     Weight 204 lb (92.5 kg)     Height 5' 3 (1.6 m)     Head Circumference      Peak Flow      Pain Score 0     Pain Loc      Pain Education      Exclude from Growth Chart     Most recent vital signs: Vitals:   11/27/23 1716  BP: 130/78  Pulse: 87  Resp: 17  Temp: 98.4 F (36.9 C)  SpO2: 96%    General: Awake, no distress.  CV:  Good peripheral perfusion.  Regular rate rhythm, normal distal pulses Resp:  Normal effort.  Clear to auscultation bilaterally, no wheezing or stridor Abd:  No  distention.  Soft nontender  Other:  mild edema of the left lower lip and left face along the jawline.  No tongue edema or elevation, floor mouth is soft, no uvula edema.  Generalized hives.   ED Results / Procedures / Treatments   Labs (all labs ordered are listed, but only abnormal results are displayed) Labs Reviewed - No data to display   EKG    RADIOLOGY    PROCEDURES:  .Critical Care  Performed by: Viviann Pastor, MD Authorized by: Viviann Pastor, MD   Critical care provider statement:    Critical care time (minutes):  15   Critical care time was exclusive of:  Separately billable procedures and treating other patients   Critical care was necessary to treat or prevent imminent or life-threatening deterioration of the following conditions:  Toxidrome   Critical care was time spent personally by me on the following activities:  Development of treatment plan with patient or surrogate, discussions with consultants, evaluation of patient's response to treatment, examination of patient, obtaining history from patient or surrogate, ordering and performing treatments and interventions, ordering and review of laboratory studies, ordering and review of radiographic studies, pulse oximetry, re-evaluation of patient's condition and review of old charts    MEDICATIONS ORDERED IN ED: Medications  EPINEPHrine  (EPI-PEN) injection 0.3 mg (0.3 mg Intramuscular Given 11/27/23 1752)  predniSONE  (DELTASONE ) tablet 60 mg (60 mg Oral Given 11/27/23 1752)  famotidine  (PEPCID ) tablet 40 mg (40 mg Oral Given 11/27/23 1752)     IMPRESSION / MDM / ASSESSMENT AND PLAN / ED COURSE  I reviewed the triage vital signs and the nursing notes.  Patient's presentation is most consistent with acute presentation with potential threat to life or bodily function.  Patient presents with anaphylactic reaction without shock/hemodynamic instability.  Will give EpiPen , prednisone , Pepcid .  She already took 100  mg of Benadryl prior to arrival.  If symptoms are improved after period of observation in the ED, I think should be noted be discharged home with prescriptions for prednisone  and EpiPen .   ----------------------------------------- 7:21 PM on 11/27/2023 ----------------------------------------- Symptoms are significantly improved.  Patient wishes to be discharged now and not continue with further observation in the emergency department.  No evidence of any respiratory compromise or hemodynamic instability.  Prescriptions for prednisone  and EpiPen  sent to her pharmacy.      FINAL CLINICAL IMPRESSION(S) / ED DIAGNOSES   Final diagnoses:  Anaphylaxis, initial encounter     Rx / DC Orders   ED Discharge Orders  Ordered    predniSONE  (DELTASONE ) 20 MG tablet  Daily with breakfast        11/27/23 1920    EPINEPHrine  0.3 mg/0.3 mL IJ SOAJ injection  As needed        11/27/23 1920             Note:  This document was prepared using Dragon voice recognition software and may include unintentional dictation errors.   Viviann Pastor, MD 11/27/23 1750    Viviann Pastor, MD 11/27/23 AVA

## 2023-12-17 ENCOUNTER — Emergency Department
Admission: EM | Admit: 2023-12-17 | Discharge: 2023-12-17 | Discharge status: Against Medical Advice | Attending: Emergency Medicine | Admitting: Emergency Medicine

## 2023-12-17 ENCOUNTER — Other Ambulatory Visit: Payer: Self-pay

## 2023-12-17 DIAGNOSIS — S60465A Insect bite (nonvenomous) of left ring finger, initial encounter: Secondary | ICD-10-CM | POA: Diagnosis present

## 2023-12-17 DIAGNOSIS — Z5321 Procedure and treatment not carried out due to patient leaving prior to being seen by health care provider: Secondary | ICD-10-CM | POA: Insufficient documentation

## 2023-12-17 DIAGNOSIS — W57XXXA Bitten or stung by nonvenomous insect and other nonvenomous arthropods, initial encounter: Secondary | ICD-10-CM | POA: Insufficient documentation

## 2023-12-17 NOTE — ED Triage Notes (Signed)
 Pt to ED via POV from home. Pt reports stung by a bee on left ring finger around 4:17pm. Pt reports used Epi to right thigh later. Pt also took 50mg  benadryl PTA. Pt denies SOB or itching. Redness and swelling noted to left ring finger.

## 2023-12-17 NOTE — ED Notes (Signed)
 Pt states that she is going home, pt feels ok and has not had any further reaction. Pt states that she will come back if her symptoms return or she gets worse.

## 2023-12-24 ENCOUNTER — Other Ambulatory Visit: Payer: Self-pay | Admitting: General Surgery

## 2023-12-24 DIAGNOSIS — Z1231 Encounter for screening mammogram for malignant neoplasm of breast: Secondary | ICD-10-CM

## 2024-02-12 ENCOUNTER — Ambulatory Visit
Admission: RE | Admit: 2024-02-12 | Discharge: 2024-02-12 | Disposition: A | Discharge status: Home / Self Care | Source: Ambulatory Visit | Attending: General Surgery | Admitting: General Surgery

## 2024-02-12 DIAGNOSIS — Z1231 Encounter for screening mammogram for malignant neoplasm of breast: Secondary | ICD-10-CM | POA: Diagnosis present
